# Patient Record
Sex: Female | Born: 1970
Health system: Southern US, Community
[De-identification: ages and names within clinical notes are randomized; demographics above are authoritative.]

## PROBLEM LIST (undated history)

## (undated) DIAGNOSIS — E785 Hyperlipidemia, unspecified: Secondary | ICD-10-CM

## (undated) HISTORY — PX: WISDOM TOOTH EXTRACTION: SHX21

## (undated) HISTORY — DX: Hyperlipidemia, unspecified: E78.5

---

## 2015-08-28 ENCOUNTER — Ambulatory Visit (INDEPENDENT_AMBULATORY_CARE_PROVIDER_SITE_OTHER): Payer: BLUE CROSS/BLUE SHIELD

## 2015-08-28 ENCOUNTER — Ambulatory Visit (INDEPENDENT_AMBULATORY_CARE_PROVIDER_SITE_OTHER): Payer: BLUE CROSS/BLUE SHIELD | Admitting: Physician Assistant

## 2015-08-28 ENCOUNTER — Encounter: Payer: Self-pay | Admitting: Physician Assistant

## 2015-08-28 VITALS — BP 112/41 | HR 73 | Ht 63.0 in | Wt 143.0 lb

## 2015-08-28 DIAGNOSIS — R928 Other abnormal and inconclusive findings on diagnostic imaging of breast: Secondary | ICD-10-CM

## 2015-08-28 DIAGNOSIS — Z1231 Encounter for screening mammogram for malignant neoplasm of breast: Secondary | ICD-10-CM | POA: Diagnosis not present

## 2015-08-28 DIAGNOSIS — K5900 Constipation, unspecified: Secondary | ICD-10-CM | POA: Insufficient documentation

## 2015-08-28 DIAGNOSIS — Z1239 Encounter for other screening for malignant neoplasm of breast: Secondary | ICD-10-CM

## 2015-08-28 NOTE — Patient Instructions (Signed)
Florastor probiotic.   Amitiza twice daily. Linzess once daily.

## 2015-08-28 NOTE — Progress Notes (Signed)
   Subjective:    Patient ID: Carrie [name], female    DOB: 10-12-1970, 45 y.o.   MRN: [id number]  HPI Pt is a 45 yo female who presents to the clinic to establish care.   .. Active Ambulatory Problems    Diagnosis Date Noted  . Constipation 08/28/2015   Resolved Ambulatory Problems    Diagnosis Date Noted  . No Resolved Ambulatory Problems   No Additional Past Medical History   .Marland Kitchen Family History  Problem Relation Age of Onset  . Diabetes Mother    .Marland Kitchen Social History   Social History  . Marital Status: Married    Spouse Name: N/A  . Number of Children: N/A  . Years of Education: N/A   Occupational History  . Not on file.   Social History Main Topics  . Smoking status: Never Smoker   . Smokeless tobacco: Not on file  . Alcohol Use: 0.0 oz/week    0 Standard drinks or equivalent per week  . Drug Use: No  . Sexual Activity: Yes   Other Topics Concern  . Not on file   Social History Narrative  . No narrative on file   Pt has ongoing contstipation. She takes a natural digestive supplement for bowels. Her stools are hard and has one a week. She eats overall healthy and full of fiber. She drinks lots of water.    Review of Systems  All other systems reviewed and are negative.      Objective:   Physical Exam  Constitutional: She is oriented to person, place, and time. She appears well-developed and well-nourished.  HENT:  Head: Normocephalic and atraumatic.  Cardiovascular: Normal rate, regular rhythm and normal heart sounds.   Pulmonary/Chest: Effort normal and breath sounds normal.  Neurological: She is alert and oriented to person, place, and time.  Psychiatric: She has a normal mood and affect. Her behavior is normal.          Assessment & Plan:  Constipation, chronic- dicussed probiotic and starting amitiza/linzess. Pt does not want to start daily medication at this time.   Mammogram ordered.

## 2015-08-29 ENCOUNTER — Encounter: Payer: Self-pay | Admitting: Physician Assistant

## 2015-08-29 DIAGNOSIS — N6310 Unspecified lump in the right breast, unspecified quadrant: Secondary | ICD-10-CM | POA: Insufficient documentation

## 2015-08-30 ENCOUNTER — Other Ambulatory Visit: Payer: Self-pay | Admitting: Physician Assistant

## 2015-08-30 DIAGNOSIS — R928 Other abnormal and inconclusive findings on diagnostic imaging of breast: Secondary | ICD-10-CM

## 2015-09-10 ENCOUNTER — Ambulatory Visit
Admission: RE | Admit: 2015-09-10 | Discharge: 2015-09-10 | Disposition: A | Discharge status: Home / Self Care | Payer: BLUE CROSS/BLUE SHIELD | Source: Ambulatory Visit | Attending: Physician Assistant | Admitting: Physician Assistant

## 2015-09-10 DIAGNOSIS — R928 Other abnormal and inconclusive findings on diagnostic imaging of breast: Secondary | ICD-10-CM

## 2015-09-17 ENCOUNTER — Encounter: Payer: Self-pay | Admitting: Physician Assistant

## 2015-09-17 ENCOUNTER — Telehealth: Payer: Self-pay | Admitting: Physician Assistant

## 2015-09-17 ENCOUNTER — Ambulatory Visit (INDEPENDENT_AMBULATORY_CARE_PROVIDER_SITE_OTHER): Payer: BLUE CROSS/BLUE SHIELD | Admitting: Physician Assistant

## 2015-09-17 ENCOUNTER — Other Ambulatory Visit (HOSPITAL_COMMUNITY)
Admission: RE | Admit: 2015-09-17 | Discharge: 2015-09-17 | Disposition: A | Discharge status: Home / Self Care | Payer: BLUE CROSS/BLUE SHIELD | Source: Ambulatory Visit | Attending: Physician Assistant | Admitting: Physician Assistant

## 2015-09-17 VITALS — BP 112/65 | HR 79 | Ht 63.0 in | Wt 146.0 lb

## 2015-09-17 DIAGNOSIS — Z01419 Encounter for gynecological examination (general) (routine) without abnormal findings: Secondary | ICD-10-CM | POA: Insufficient documentation

## 2015-09-17 DIAGNOSIS — D241 Benign neoplasm of right breast: Secondary | ICD-10-CM

## 2015-09-17 DIAGNOSIS — K5900 Constipation, unspecified: Secondary | ICD-10-CM | POA: Diagnosis not present

## 2015-09-17 DIAGNOSIS — Z0000 Encounter for general adult medical examination without abnormal findings: Secondary | ICD-10-CM

## 2015-09-17 DIAGNOSIS — Z1151 Encounter for screening for human papillomavirus (HPV): Secondary | ICD-10-CM | POA: Insufficient documentation

## 2015-09-17 NOTE — Progress Notes (Signed)
  Subjective:     Carrie [name] is a 45 y.o. female and is here for a comprehensive physical exam. The patient reports problems - hx of constipation. she has not started probiotic at this time that was discussed at last visit. .has BM every 4-6 days. Hx of constipation since teenage years.   LMP 3 weeks ago. Fairly regular cycles. No hx of abnormal pap. Last pap 3-4 years ago.  Social History   Social History  . Marital Status: Married    Spouse Name: N/A  . Number of Children: N/A  . Years of Education: N/A   Occupational History  . Not on file.   Social History Main Topics  . Smoking status: Never Smoker   . Smokeless tobacco: Not on file  . Alcohol Use: 0.0 oz/week    0 Standard drinks or equivalent per week  . Drug Use: No  . Sexual Activity: Yes   Other Topics Concern  . Not on file   Social History Narrative   Health Maintenance  Topic Date Due  . HIV Screening  04/07/1985  . TETANUS/TDAP  04/07/1989  . PAP SMEAR  04/08/1991  . INFLUENZA VACCINE  08/27/2016 (Originally 02/04/2015)    The following portions of the patient's history were reviewed and updated as appropriate: allergies, current medications, past family history, past medical history, past social history, past surgical history and problem list.  Review of Systems Pertinent items noted in HPI and remainder of comprehensive ROS otherwise negative.   Objective:    BP 112/65 mmHg  Pulse 79  Ht 5\' 3"  (1.6 m)  Wt 146 lb (66.225 kg)  BMI 25.87 kg/m2  LMP 09/02/2015 (Approximate) General appearance: alert, cooperative and appears stated age Head: Normocephalic, without obvious abnormality, atraumatic Eyes: conjunctivae/corneas clear. PERRL, EOM's intact. Fundi benign. Ears: normal TM's and external ear canals both ears Nose: Nares normal. Septum midline. Mucosa normal. No drainage or sinus tenderness. Throat: lips, mucosa, and tongue normal; teeth and gums normal Neck: no adenopathy, no carotid bruit, no  JVD, supple, symmetrical, trachea midline and thyroid not enlarged, symmetric, no tenderness/mass/nodules Back: symmetric, no curvature. ROM normal. No CVA tenderness. Lungs: clear to auscultation bilaterally Heart: regular rate and rhythm, S1, S2 normal, no murmur, click, rub or gallop Abdomen: soft, non-tender; bowel sounds normal; no masses,  no organomegaly Pelvic: cervix normal in appearance, external genitalia normal, no adnexal masses or tenderness, no cervical motion tenderness, uterus normal size, shape, and consistency and vagina normal without discharge Extremities: extremities normal, atraumatic, no cyanosis or edema Pulses: 2+ and symmetric Skin: Skin color, texture, turgor normal. No rashes or lesions Lymph nodes: Cervical, supraclavicular, and axillary nodes normal. Neurologic: Grossly normal    Assessment:    Healthy female exam.      Plan:    CPE- declines tdap. pap done today.declines STD testing.  Mammogram up to date. 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 Nurse will call to get them and scan in. Discussed vitamin D 800 units and calcium. Continue healthy diet and exercise.   Fibroadenoma of right breast- will follow in 6 months.   Constipation- discussed probiotics and/or miralax. If not improving linzess or amitiza discussed.  See After Visit Summary for Counseling Recommendations

## 2015-09-17 NOTE — Patient Instructions (Signed)

## 2015-09-17 NOTE — Telephone Encounter (Signed)
Please call down to occupational health and get copy of patients fasting labs.

## 2015-09-19 LAB — CYTOLOGY - PAP

## 2015-09-19 NOTE — Telephone Encounter (Signed)
I spoke with pt to confirm where she had labs drawn & she said in our lab downstairs.  I called the main [facility] office and they don't have her in their system anywhere.  Spoke with pt again and she is going to try to locate the results because the healthy living part of her insurance has already gotten them and deposited the money into her acct.  She will call me back with information.

## 2016-04-02 ENCOUNTER — Other Ambulatory Visit: Payer: Self-pay | Admitting: Physician Assistant

## 2016-04-02 DIAGNOSIS — N6310 Unspecified lump in the right breast, unspecified quadrant: Secondary | ICD-10-CM

## 2016-04-07 ENCOUNTER — Other Ambulatory Visit: Payer: Self-pay

## 2016-04-07 ENCOUNTER — Other Ambulatory Visit: Payer: Self-pay | Admitting: Physician Assistant

## 2016-04-07 DIAGNOSIS — N6310 Unspecified lump in the right breast, unspecified quadrant: Secondary | ICD-10-CM

## 2016-04-08 ENCOUNTER — Ambulatory Visit
Admission: RE | Admit: 2016-04-08 | Discharge: 2016-04-08 | Disposition: A | Discharge status: Home / Self Care | Payer: BLUE CROSS/BLUE SHIELD | Source: Ambulatory Visit | Attending: Physician Assistant | Admitting: Physician Assistant

## 2016-04-08 DIAGNOSIS — N6310 Unspecified lump in the right breast, unspecified quadrant: Secondary | ICD-10-CM | POA: Diagnosis not present

## 2017-03-12 ENCOUNTER — Other Ambulatory Visit: Payer: Self-pay | Admitting: Physician Assistant

## 2017-03-12 DIAGNOSIS — N6310 Unspecified lump in the right breast, unspecified quadrant: Secondary | ICD-10-CM

## 2017-03-19 ENCOUNTER — Inpatient Hospital Stay: Admission: RE | Admit: 2017-03-19 | Payer: BLUE CROSS/BLUE SHIELD | Source: Ambulatory Visit

## 2017-03-22 ENCOUNTER — Other Ambulatory Visit: Payer: BLUE CROSS/BLUE SHIELD

## 2017-03-26 ENCOUNTER — Ambulatory Visit
Admission: RE | Admit: 2017-03-26 | Discharge: 2017-03-26 | Disposition: A | Discharge status: Home / Self Care | Payer: BLUE CROSS/BLUE SHIELD | Source: Ambulatory Visit | Attending: Physician Assistant | Admitting: Physician Assistant

## 2017-03-26 DIAGNOSIS — N6310 Unspecified lump in the right breast, unspecified quadrant: Secondary | ICD-10-CM

## 2017-03-26 DIAGNOSIS — R922 Inconclusive mammogram: Secondary | ICD-10-CM | POA: Diagnosis not present

## 2017-08-17 ENCOUNTER — Ambulatory Visit (INDEPENDENT_AMBULATORY_CARE_PROVIDER_SITE_OTHER): Payer: BLUE CROSS/BLUE SHIELD | Admitting: Physician Assistant

## 2017-08-17 ENCOUNTER — Encounter: Payer: Self-pay | Admitting: Physician Assistant

## 2017-08-17 VITALS — BP 123/67 | HR 77 | Ht 63.0 in | Wt 141.0 lb

## 2017-08-17 DIAGNOSIS — Z0000 Encounter for general adult medical examination without abnormal findings: Secondary | ICD-10-CM | POA: Diagnosis not present

## 2017-08-17 DIAGNOSIS — Z780 Asymptomatic menopausal state: Secondary | ICD-10-CM

## 2017-08-17 DIAGNOSIS — Z131 Encounter for screening for diabetes mellitus: Secondary | ICD-10-CM | POA: Diagnosis not present

## 2017-08-17 DIAGNOSIS — N938 Other specified abnormal uterine and vaginal bleeding: Secondary | ICD-10-CM

## 2017-08-17 NOTE — Patient Instructions (Addendum)
Abnormal Uterine Bleeding Abnormal uterine bleeding means bleeding more than usual from your uterus. It can include:  Bleeding between periods.  Bleeding after sex.  Bleeding that is heavier than normal.  Periods that last longer than usual.  Bleeding after you have stopped having your period (menopause).  There are many problems that may cause this. You should see a doctor for any kind of bleeding that is not normal. Treatment depends on the cause of the bleeding. Follow these instructions at home:  Watch your condition for any changes.  Do not use tampons, douche, or have sex, if your doctor tells you not to.  Change your pads often.  Get regular well-woman exams. Make sure they include a pelvic exam and cervical cancer screening.  Keep all follow-up visits as told by your doctor. This is important. Contact a doctor if:  The bleeding lasts more than one week.  You feel dizzy at times.  You feel like you are going to throw up (nauseous).  You throw up. Get help right away if:  You pass out.  You have to change pads every hour.  You have belly (abdominal) pain.  You have a fever.  You get sweaty.  You get weak.  You passing large blood clots from your vagina. Summary  Abnormal uterine bleeding means bleeding more than usual from your uterus.  There are many problems that may cause this. You should see a doctor for any kind of bleeding that is not normal.  Treatment depends on the cause of the bleeding. This information is not intended to replace advice given to you by your health care provider. Make sure you discuss any questions you have with your health care provider. Document Released: 04/19/2009 Document Revised: 06/16/2016 Document Reviewed: 06/16/2016 Elsevier Interactive Patient Education  2017 [place] [employer]  Get These Tests 1. Blood Pressure- Have your blood pressure checked once a year by your health care provider.  Normal  blood pressure is 120/80. 2. Weight- Have your body mass index (BMI) calculated to screen for obesity.  BMI is measure of body fat based on height and weight.  You can also calculate your own BMI at GravelBags.it. 3. Cholesterol- Have your cholesterol checked every 5 years starting at age 26 then yearly starting at age 48. 56. Chlamydia, HIV, and other sexually transmitted diseases- Get screened every year until age 37, then within three months of each new sexual provider. 5. Pap Test - Every 1-5 years; discuss with your health care provider. 6. Mammogram- Every 1-2 years starting at age 50--50  Take these medicines  Calcium with Vitamin D-Your body needs 1200 mg of Calcium each day and [phone] IU of Vitamin D daily.  Your body can only absorb 500 mg of Calcium at a time so Calcium must be taken in 2 or 3 divided doses throughout the day.  Multivitamin with folic acid- Once daily if it is possible for you to become pregnant.  Get these Immunizations  Gardasil-Series of three doses; prevents HPV related illness such as genital warts and cervical cancer.  Menactra-Single dose; prevents meningitis.  Tetanus shot- Every 10 years.  Flu shot-Every year.  Take these steps 1. Do not smoke-Your healthcare provider can help you quit.  For tips on how to quit go to www.smokefree.gov or call 1-800 QUITNOW. 2. Be physically active- Exercise 5 days a week for at least 30 minutes.  If you are not already physically active, start slow and gradually work up to  30 minutes of moderate physical activity.  Examples of moderate activity include walking briskly, dancing, swimming, bicycling, etc. 3. Breast Cancer- A self breast exam every month is important for early detection of breast cancer.  For more information and instruction on self breast exams, ask your healthcare provider or https://www.patel.info/. 4. Eat a healthy diet- Eat a variety of healthy foods such as fruits,  vegetables, whole grains, low fat milk, low fat cheeses, yogurt, lean meats, poultry and fish, beans, nuts, tofu, etc.  For more information go to www. Thenutritionsource.org 5. Drink alcohol in moderation- Limit alcohol intake to one drink or less per day. Never drink and drive. 6. Depression- Your emotional health is as important as your physical health.  If you're feeling down or losing interest in things you normally enjoy please talk to your healthcare provider about being screened for depression. 7. Dental visit- Brush and floss your teeth twice daily; visit your dentist twice a year. 8. Eye doctor- Get an eye exam at least every 2 years. 9. Helmet use- Always wear a helmet when riding a bicycle, motorcycle, rollerblading or skateboarding. 26. Safe sex- If you may be exposed to sexually transmitted infections, use a condom. 11. Seat belts- Seat belts can save your live; always wear one. 12. Smoke/Carbon Monoxide detectors- These detectors need to be installed on the appropriate level of your home. Replace batteries at least once a year. 13. Skin cancer- When out in the sun please cover up and use sunscreen 15 SPF or higher. 14. Violence- If anyone is threatening or hurting you, please tell your healthcare provider.

## 2017-08-17 NOTE — Progress Notes (Signed)
Subjective:     Carrie [name] is a 47 y.o. female and is here for a comprehensive physical exam. The patient reports problems - see below.    Pt has been having some ongoing persisent bleeding. She was not having a period very often and then in November she had a period that lasted almost 6 weeks. Bleeding was light some days and heavier others. She had a few week break and then bleeding started again. She denies any fever, abdominal pain, pain with intercourse. She wonders if she is headed towards menopause.   Social History   Socioeconomic History  . Marital status: Married    Spouse name: Not on file  . Number of children: Not on file  . Years of education: Not on file  . Highest education level: Not on file  Social Needs  . Financial resource strain: Not on file  . Food insecurity - worry: Not on file  . Food insecurity - inability: Not on file  . Transportation needs - medical: Not on file  . Transportation needs - non-medical: Not on file  Occupational History  . Not on file  Tobacco Use  . Smoking status: Never Smoker  Substance and Sexual Activity  . Alcohol use: Yes    Alcohol/week: 0.0 oz  . Drug use: No  . Sexual activity: Yes  Other Topics Concern  . Not on file  Social History Narrative  . Not on file   Health Maintenance  Topic Date Due  . HIV Screening  04/07/1985  . TETANUS/TDAP  04/07/1989  . INFLUENZA VACCINE  02/03/2017  . PAP SMEAR  09/17/2018    The following portions of the patient's history were reviewed and updated as appropriate: allergies, current medications, past family history, past medical history, past social history, past surgical history and problem list.  Review of Systems Pertinent items noted in HPI and remainder of comprehensive ROS otherwise negative.   Objective:    BP 123/67   Pulse 77   Ht 5\' 3"  (1.6 m)   Wt 141 lb (64 kg)   BMI 24.98 kg/m  General appearance: alert, cooperative and appears stated age Head: Normocephalic,  without obvious abnormality, atraumatic Eyes: conjunctivae/corneas clear. PERRL, EOM's intact. Fundi benign. Ears: normal TM's and external ear canals both ears Nose: Nares normal. Septum midline. Mucosa normal. No drainage or sinus tenderness. Throat: lips, mucosa, and tongue normal; teeth and gums normal Neck: no adenopathy, no carotid bruit, no JVD, supple, symmetrical, trachea midline and thyroid not enlarged, symmetric, no tenderness/mass/nodules Back: symmetric, no curvature. ROM normal. No CVA tenderness. Lungs: clear to auscultation bilaterally Breasts: normal appearance, no masses or tenderness Heart: regular rate and rhythm, S1, S2 normal, no murmur, click, rub or gallop Abdomen: soft, non-tender; bowel sounds normal; no masses,  no organomegaly Extremities: extremities normal, atraumatic, no cyanosis or edema Pulses: 2+ and symmetric Skin: Skin color, texture, turgor normal. No rashes or lesions Lymph nodes: Cervical, supraclavicular, and axillary nodes normal. Neurologic: Alert and oriented X 3, normal strength and tone. Normal symmetric reflexes. Normal coordination and gait   pt declined pelvic exam today due to bleeding.  Assessment:    Healthy female exam.      Plan:    Carrie KitchenMarland [name] was seen today for annual exam.  Diagnoses and all orders for this visit:  Routine physical examination -     FSH/LH -     COMPLETE METABOLIC PANEL WITH GFR -     TSH -  CBC with Differential/Platelet  DUB (dysfunctional uterine bleeding) -     FSH/LH -     COMPLETE METABOLIC PANEL WITH GFR -     TSH -     CBC with Differential/Platelet -     US PELVIS (TRANSABDOMINAL ONLY) -     US PELVIS TRANSVANGINAL NON-OB (TV ONLY)  Screening for diabetes mellitus -     COMPLETE METABOLIC PANEL WITH GFR  .Carrie Kitchen Depression screen [facility] 2/9 08/17/2017  Decreased Interest 0  Down, Depressed, Hopeless 0  PHQ - 2 Score 0  Altered sleeping 1  Tired, decreased energy 1  Change in appetite 0   Feeling bad or failure about yourself  0  Trouble concentrating 0  Moving slowly or fidgety/restless 0  Suicidal thoughts 0  PHQ-9 Score 2  Difficult doing work/chores Not difficult at all    Labs ordered today. Will check CBC to look for any low hgb from blood loss.  Pt has close follow up of right breast mass which is suspected to be fibroadenoma.  Declined Pap and pelvic today.  Declined flu shot.  Fasting cholesterol to be done at work.   .. Discussed 150 minutes of exercise a week.  Encouraged vitamin D 1000 units and Calcium 1300mg  or 4 servings of dairy a day.   Discussed DUB. Labs ordered along with ultrasound. Discussed progesterone to stop bleeding. She would like to hold off for now. Discussed ablation if bleeding did not stop. Follow up as needed. Will consider referral if bleeding does not stop or would like to consider albation. Will confirm menopausal status with testing.    See After Visit Summary for Counseling Recommendations

## 2017-08-18 ENCOUNTER — Encounter: Payer: Self-pay | Admitting: Physician Assistant

## 2017-08-18 DIAGNOSIS — N938 Other specified abnormal uterine and vaginal bleeding: Secondary | ICD-10-CM | POA: Insufficient documentation

## 2017-08-18 DIAGNOSIS — Z780 Asymptomatic menopausal state: Secondary | ICD-10-CM | POA: Insufficient documentation

## 2017-08-18 LAB — CBC WITH DIFFERENTIAL/PLATELET
BASOS PCT: 0.9 %
Basophils Absolute: 60 cells/uL (ref 0–200)
Eosinophils Absolute: 281 cells/uL (ref 15–500)
Eosinophils Relative: 4.2 %
HCT: 38.5 % (ref 35.0–45.0)
HEMOGLOBIN: 12.6 g/dL (ref 11.7–15.5)
Lymphs Abs: 2305 cells/uL (ref 850–3900)
MCH: 27.5 pg (ref 27.0–33.0)
MCHC: 32.7 g/dL (ref 32.0–36.0)
MCV: 83.9 fL (ref 80.0–100.0)
MONOS PCT: 10.1 %
MPV: 10.6 fL (ref 7.5–12.5)
NEUTROS ABS: 3377 {cells}/uL (ref 1500–7800)
Neutrophils Relative %: 50.4 %
PLATELETS: 348 10*3/uL (ref 140–400)
RBC: 4.59 10*6/uL (ref 3.80–5.10)
RDW: 12.3 % (ref 11.0–15.0)
TOTAL LYMPHOCYTE: 34.4 %
WBC mixed population: 677 cells/uL (ref 200–950)
WBC: 6.7 10*3/uL (ref 3.8–10.8)

## 2017-08-18 LAB — COMPLETE METABOLIC PANEL WITH GFR
AG Ratio: 1.6 (calc) (ref 1.0–2.5)
ALKALINE PHOSPHATASE (APISO): 74 U/L (ref 33–115)
ALT: 16 U/L (ref 6–29)
AST: 26 U/L (ref 10–35)
Albumin: 4.7 g/dL (ref 3.6–5.1)
BILIRUBIN TOTAL: 0.4 mg/dL (ref 0.2–1.2)
BUN: 12 mg/dL (ref 7–25)
CHLORIDE: 102 mmol/L (ref 98–110)
CO2: 27 mmol/L (ref 20–32)
Calcium: 9.8 mg/dL (ref 8.6–10.2)
Creat: 0.7 mg/dL (ref 0.50–1.10)
GFR, EST AFRICAN AMERICAN: 120 mL/min/{1.73_m2} (ref >=60)
GFR, Est Non African American: 103 mL/min/{1.73_m2} (ref >=60)
GLUCOSE: 84 mg/dL (ref 65–99)
Globulin: 3 g/dL (calc) (ref 1.9–3.7)
POTASSIUM: 4 mmol/L (ref 3.5–5.3)
Sodium: 137 mmol/L (ref 135–146)
TOTAL PROTEIN: 7.7 g/dL (ref 6.1–8.1)

## 2017-08-18 LAB — FSH/LH
FSH: 25.2 m[IU]/mL
LH: 12.4 m[IU]/mL

## 2017-08-18 LAB — TSH: TSH: 3.19 m[IU]/L

## 2017-08-25 ENCOUNTER — Ambulatory Visit (INDEPENDENT_AMBULATORY_CARE_PROVIDER_SITE_OTHER): Payer: BLUE CROSS/BLUE SHIELD

## 2017-08-25 DIAGNOSIS — N938 Other specified abnormal uterine and vaginal bleeding: Secondary | ICD-10-CM

## 2017-08-25 NOTE — Progress Notes (Signed)
Call pt: u/s showed no fibroids or masses. Appears to be benign cause of endometrial atrophy. Are you interested in trying progesterone to see if we can get bleeding back on track?

## 2018-12-27 DIAGNOSIS — W07XXXA Fall from chair, initial encounter: Secondary | ICD-10-CM | POA: Diagnosis not present

## 2018-12-27 DIAGNOSIS — R220 Localized swelling, mass and lump, head: Secondary | ICD-10-CM | POA: Diagnosis not present

## 2018-12-27 DIAGNOSIS — S0990XA Unspecified injury of head, initial encounter: Secondary | ICD-10-CM | POA: Diagnosis not present

## 2018-12-27 DIAGNOSIS — W2209XA Striking against other stationary object, initial encounter: Secondary | ICD-10-CM | POA: Diagnosis not present

## 2018-12-27 DIAGNOSIS — R030 Elevated blood-pressure reading, without diagnosis of hypertension: Secondary | ICD-10-CM | POA: Diagnosis not present

## 2018-12-27 DIAGNOSIS — S0101XA Laceration without foreign body of scalp, initial encounter: Secondary | ICD-10-CM | POA: Diagnosis not present

## 2018-12-28 ENCOUNTER — Encounter: Payer: Self-pay | Admitting: Physician Assistant

## 2018-12-28 ENCOUNTER — Other Ambulatory Visit: Payer: Self-pay

## 2018-12-28 ENCOUNTER — Ambulatory Visit (INDEPENDENT_AMBULATORY_CARE_PROVIDER_SITE_OTHER): Payer: BC Managed Care – PPO | Admitting: Physician Assistant

## 2018-12-28 VITALS — BP 123/75 | HR 90 | Temp 97.9°F | Ht 63.0 in | Wt 136.0 lb

## 2018-12-28 DIAGNOSIS — S060X0A Concussion without loss of consciousness, initial encounter: Secondary | ICD-10-CM | POA: Insufficient documentation

## 2018-12-28 DIAGNOSIS — S060X0D Concussion without loss of consciousness, subsequent encounter: Secondary | ICD-10-CM

## 2018-12-28 DIAGNOSIS — F0781 Postconcussional syndrome: Secondary | ICD-10-CM | POA: Insufficient documentation

## 2018-12-28 NOTE — Patient Instructions (Signed)
Concussion, Adult A concussion is a brain injury from a direct hit (blow) to the head or body. This injury causes the brain to shake quickly back and forth inside the skull. It is caused by:  A hit to the head.  A quick and sudden movement (jolt) of the head or neck. How fast you will get better from a concussion depends on many things. Recovery can take time. It is important to wait to return to activity until a doctor says it is safe and your symptoms are all gone. Follow these instructions at home: Activity  Limit activities that need a lot of thought or concentration. You may need to talk with your work Freight forwarder or teachers about this. Limit activities such as: ? Homework or work for your job. ? Watching TV. ? Computer work. ? Playing memory games and puzzles.  Rest. Rest helps the brain to heal. Make sure you: ? Get plenty of sleep at night. Do not stay up late. ? Rest during the day. Take naps or rest breaks when you feel tired.  Do not do activities that could cause a second concussion, such as riding a bike or playing sports. It can be dangerous if you get another concussion before the first one has healed.  Ask your doctor when you can return to your normal activities, like driving, riding a bike, or using machinery. Your ability to react may be slower. Do not do these activities if you are dizzy. Your doctor will likely give you a plan for slowly going back to activities. General instructions  Take over-the-counter and prescription medicines only as told by your doctor.  Do not drink alcohol until your doctor says you can.  Watch your symptoms and tell other people to do the same. Other problems (complications) can happen after a concussion. Older adults with a brain injury may have a higher risk of serious problems, such as a blood clot in the brain.  Tell your work Freight forwarder, teachers, Government social research officer, school counselor, coach, or Product/process development scientist about your injury and  symptoms. Tell them about what you can or cannot do. They should watch you for: ? More problems with attention or concentration. ? More trouble remembering or learning new information. ? More time needed to do tasks or assignments. ? Being more annoyed (irritable) or having a harder time dealing with stress. ? Any other symptoms that get worse.  Keep all follow-up visits as told by your doctor. This is important. Prevention  It is very important that you donot get another brain injury, especially before you have healed. In rare cases, another injury can cause permanent brain damage, brain swelling, or death. You have the most risk if you get another head injury in the first 7-10 days after you were hurt before. To avoid injuries: ? Avoid activities that could make you get a second concussion, like contact sports. ? When you have returned to sports or activities:  Avoid plays or moves that can cause you to crash into another person. This is how most concussions happen.  Follow the rules and be respectful of other players. ? Get regular exercise that includes strength and balance training. ? Wear a helmet when you do activities like:  Biking.  Skiing.  Skateboarding.  Skating. ? Helmets can help protect you from serious skull and brain injuries, but they do not protect your from a concussion. Even when wearing a helmet, you should avoid being hit in the head. Contact a doctor if:  Your symptoms get worse or they do not get better.  You have new symptoms.  You have another injury. Get help right away if:  You have bad headaches or your headaches get worse.  You have weakness in any part of your body.  You are confused.  Your coordination gets worse.  You keep throwing up (vomiting).  You feel more sleepy than normal.  You twitch or shake violently (convulse) or have a seizure.  Your speech is not clear (is slurred).  You have strange behavior changes.  You have  changes in how you see (vision).  You pass out (lose consciousness). Summary  A concussion is a brain injury from a direct hit (blow) to the head or body.  This condition is treated with rest and careful watching of symptoms.  If you keep having symptoms, call your doctor. This information is not intended to replace advice given to you by your health care provider. Make sure you discuss any questions you have with your health care provider. Document Released: 06/10/2009 Document Revised: 08/03/2017 Document Reviewed: 08/03/2017 Elsevier Interactive Patient Education  2019 [employer]   Post-Concussion Syndrome  Post-concussion syndrome is when symptoms last longer than normal after a head injury. What are the signs or symptoms? After a head injury, you may:  Have headaches.  Feel tired.  Feel dizzy.  Feel weak.  Have trouble seeing.  Have trouble in bright lights.  Have trouble hearing.  Not be able to remember things.  Not be able to focus.  Have trouble sleeping.  Have mood swings.  Have trouble learning new things. These can last from weeks to months. Follow these instructions at home: Medicines  Take all medicines only as told by your doctor.  Do not take prescription pain medicines. Activity  Limit activities as told by your doctor. This includes: ? Homework. ? Job-related work. ? Thinking. ? Watching TV. ? Using a computer or phone. ? Puzzles. ? Exercise. ? Sports.  Slowly return to your normal activity as told by your doctor.  Stop an activity if you have symptoms.  Do not do anything that may cause you to get injured again. General instructions  Rest. Try to: ? Sleep 7-9 hours each night. ? Take naps or breaks when you feel tired during the day.  Do not drink alcohol until your doctor says that you can.  Keep track of your symptoms.  Keep all follow-up visits as told by your doctor. This is important. Contact a doctor if:   You do not improve.  You get worse.  You have another injury. Get help right away if:  You have a very bad headache.  You feel confused.  You feel very sleepy.  You pass out (faint).  You throw up (vomit).  You feel weak in any part of your body.  You feel numb in any part of your body.  You start shaking (have a seizure).  You have trouble talking. Summary  Post-concussion syndrome is when symptoms last longer than normal after a head injury.  Limit all activity after your injury. Gradually return to normal activity as told by your doctor.  Rest, do not drink alcohol, and avoid prescription pain medicines after a concussion.  Call your doctor if your symptoms get worse. This information is not intended to replace advice given to you by your health care provider. Make sure you discuss any questions you have with your health care provider. Document Released: 07/30/2004 Document Revised: 07/27/2017 Document  Reviewed: 07/27/2017 Elsevier Interactive Patient Education  [employer].

## 2018-12-28 NOTE — Progress Notes (Signed)
   Subjective:    Patient ID: Carrie [name], female    DOB: [birth year], 48 y.o.   MRN: [id number]  HPI Pt is a 47 yo female who presents to the clinic to follow up after "fall while standing in a folding chair in my basement yesterday and the back of my head hit the concrete floor". No LOC. She went to ED last night. No CT was done. 3 staples were placed in the back of her head. She got up this morning to go for a walk and felt nauseated, weak and dizzy. She laid down and now she feels better. She wanted to get checked out.   .. Active Ambulatory Problems    Diagnosis Date Noted  . Constipation 08/28/2015  . Breast mass, right 08/29/2015  . Fibroadenoma of right breast 09/17/2015  . DUB (dysfunctional uterine bleeding) 08/18/2017  . Post-menopausal 08/18/2017   Resolved Ambulatory Problems    Diagnosis Date Noted  . No Resolved Ambulatory Problems   No Additional Past Medical History        Review of Systems    see HPI.  Objective:   Physical Exam Vitals signs reviewed.  Constitutional:      Appearance: Normal appearance.  HENT:     Head:     Comments: 2 staples back of right side of head.     Right Ear: Tympanic membrane normal.     Left Ear: Tympanic membrane normal.     Nose: Nose normal.  Eyes:     Extraocular Movements: Extraocular movements intact.     Pupils: Pupils are equal, round, and reactive to light.  Cardiovascular:     Rate and Rhythm: Normal rate.     Pulses: Normal pulses.  Pulmonary:     Effort: Pulmonary effort is normal.  Neurological:     General: No focal deficit present.     Mental Status: She is alert and oriented to person, place, and time.     Cranial Nerves: No cranial nerve deficit.     Sensory: No sensory deficit.     Motor: No weakness.     Coordination: Coordination normal.     Gait: Gait normal.     Deep Tendon Reflexes: Reflexes normal.  Psychiatric:        Mood and Affect: Mood normal.           Assessment & Plan:   Marland KitchenMarland KitchenFabienne was seen today for fall.  Diagnoses and all orders for this visit:  Concussion without loss of consciousness, subsequent encounter  Post concussion syndrome   Neuro exam intact.discussed this is very reassuring.no need for CT.  Patient is feeling better. Discussed limit physical exertion and try to get brain rest. Stay hydrated. Ok to take ibuprofen and tylenol for pain/headache at this point. HO given.   Return on Monday for staple removal.

## 2019-01-02 ENCOUNTER — Ambulatory Visit (INDEPENDENT_AMBULATORY_CARE_PROVIDER_SITE_OTHER): Payer: BC Managed Care – PPO | Admitting: Physician Assistant

## 2019-01-02 ENCOUNTER — Encounter: Payer: Self-pay | Admitting: Physician Assistant

## 2019-01-02 VITALS — BP 121/83 | HR 76 | Temp 98.1°F | Ht 63.0 in | Wt 137.0 lb

## 2019-01-02 DIAGNOSIS — F0781 Postconcussional syndrome: Secondary | ICD-10-CM | POA: Diagnosis not present

## 2019-01-02 DIAGNOSIS — S0101XD Laceration without foreign body of scalp, subsequent encounter: Secondary | ICD-10-CM | POA: Diagnosis not present

## 2019-01-02 NOTE — Progress Notes (Signed)
   Subjective:    Patient ID: Carrie [name], female    DOB: 03/09/1971, 48 y.o.   MRN: [id number]  HPI Pt is a 48 yo female who presents to the clinic to have staples removed after fall with head laceration and post concussion symptoms. Her fall was 12/27/18. She came into office on 12/28/18 for concussion symptoms. She is doing great today. Head is a little sore. No other problems or concerns.   .. Active Ambulatory Problems    Diagnosis Date Noted  . Constipation 08/28/2015  . Breast mass, right 08/29/2015  . Fibroadenoma of right breast 09/17/2015  . DUB (dysfunctional uterine bleeding) 08/18/2017  . Post-menopausal 08/18/2017  . Post concussion syndrome 12/28/2018  . Concussion with no loss of consciousness 12/28/2018   Resolved Ambulatory Problems    Diagnosis Date Noted  . No Resolved Ambulatory Problems   No Additional Past Medical History     Review of Systems See HPI.     Objective:   Physical Exam HENT:     Head:            Assessment & Plan:  Marland KitchenMarland KitchenBilli was seen today for suture / staple removal.  Diagnoses and all orders for this visit:  Scalp laceration, subsequent encounter  Post concussion syndrome   Pt has had no more symptoms of concussion.  Removed 3 staples. No issues.  Cleaned with alcohol and placed Bactroban on site.  Follow up as needed.

## 2019-01-05 ENCOUNTER — Telehealth: Payer: Self-pay | Admitting: Neurology

## 2019-01-05 NOTE — Telephone Encounter (Signed)
Avoid getting up or standing up suddenly and avoid doing any strenuous activity or exercise.  Just make sure hydrating well.  Even okay to eat a little extra salt today.  If it suddenly gets worse or she develops any vomiting then please seek emergency care as we will be closed tomorrow.

## 2019-01-05 NOTE — Telephone Encounter (Signed)
Patient made aware of advise/recommendations.

## 2019-01-05 NOTE — Telephone Encounter (Signed)
Patient left vm stating she is having some new lightheadedness going on today. Its intermittent. She has been resting. She is status post concussion after fall and hitting the back of her head on 12/27/2018. She had staples removed Monday. Patient of Luvenia Starch, please advise.

## 2019-06-19 DIAGNOSIS — Z20828 Contact with and (suspected) exposure to other viral communicable diseases: Secondary | ICD-10-CM | POA: Diagnosis not present

## 2019-08-08 LAB — BASIC METABOLIC PANEL: Glucose: 86

## 2019-08-08 LAB — LIPID PANEL
Cholesterol: 282 — AB (ref 0–200)
HDL: 103 — AB (ref 35–70)
LDL Cholesterol: 165
Triglycerides: 51 (ref 40–160)

## 2019-08-10 ENCOUNTER — Telehealth: Payer: Self-pay | Admitting: Physician Assistant

## 2019-08-10 NOTE — Telephone Encounter (Signed)
Patient states that she got blood work downstairs and wanted to know if she could use that for her physical coming up. If she can how would that be transferred. Please advise.

## 2019-08-11 NOTE — Telephone Encounter (Signed)
Can we look into this. I do not see any recent blood work. Who was it ordered by? Or can we look in portal. If it is the right labs we can certainly use for [facility].

## 2019-08-14 NOTE — Telephone Encounter (Signed)
Patient made aware.

## 2019-08-14 NOTE — Telephone Encounter (Signed)
Those are the basics. We usually do liver and kidney as well. Bring them in if we need to order can order at [facility].

## 2019-08-14 NOTE — Telephone Encounter (Signed)
Patient had lipid panel and glucose. Will need more labs but printed results for Carrie [name] to review.

## 2019-08-15 ENCOUNTER — Ambulatory Visit (INDEPENDENT_AMBULATORY_CARE_PROVIDER_SITE_OTHER): Payer: BC Managed Care – PPO | Admitting: Physician Assistant

## 2019-08-15 ENCOUNTER — Other Ambulatory Visit: Payer: Self-pay

## 2019-08-15 ENCOUNTER — Other Ambulatory Visit (HOSPITAL_COMMUNITY)
Admission: RE | Admit: 2019-08-15 | Discharge: 2019-08-15 | Disposition: A | Discharge status: Home / Self Care | Payer: BC Managed Care – PPO | Source: Ambulatory Visit | Attending: Physician Assistant | Admitting: Physician Assistant

## 2019-08-15 VITALS — BP 129/69 | HR 75 | Ht 63.0 in | Wt 143.0 lb

## 2019-08-15 DIAGNOSIS — Z124 Encounter for screening for malignant neoplasm of cervix: Secondary | ICD-10-CM | POA: Insufficient documentation

## 2019-08-15 DIAGNOSIS — Z0000 Encounter for general adult medical examination without abnormal findings: Secondary | ICD-10-CM

## 2019-08-15 DIAGNOSIS — M25561 Pain in right knee: Secondary | ICD-10-CM

## 2019-08-15 DIAGNOSIS — N951 Menopausal and female climacteric states: Secondary | ICD-10-CM

## 2019-08-15 NOTE — Patient Instructions (Addendum)
Health Maintenance, Female Adopting a healthy lifestyle and getting preventive care are important in promoting health and wellness. Ask your health care provider about:  The right schedule for you to have regular tests and exams.  Things you can do on your own to prevent diseases and keep yourself healthy. What should I know about diet, weight, and exercise? Eat a healthy diet   Eat a diet that includes plenty of vegetables, fruits, low-fat dairy products, and lean protein.  Do not eat a lot of foods that are high in solid fats, added sugars, or sodium. Maintain a healthy weight Body mass index (BMI) is used to identify weight problems. It estimates body fat based on height and weight. Your health care provider can help determine your BMI and help you achieve or maintain a healthy weight. Get regular exercise Get regular exercise. This is one of the most important things you can do for your health. Most adults should:  Exercise for at least 150 minutes each week. The exercise should increase your heart rate and make you sweat (moderate-intensity exercise).  Do strengthening exercises at least twice a week. This is in addition to the moderate-intensity exercise.  Spend less time sitting. Even light physical activity can be beneficial. Watch cholesterol and blood lipids Have your blood tested for lipids and cholesterol at 50 years of age, then have this test every 5 years. Have your cholesterol levels checked more often if:  Your lipid or cholesterol levels are high.  You are older than 50 years of age.  You are at high risk for heart disease. What should I know about cancer screening? Depending on your health history and family history, you may need to have cancer screening at various ages. This may include screening for:  Breast cancer.  Cervical cancer.  Colorectal cancer.  Skin cancer.  Lung cancer. What should I know about heart disease, diabetes, and high blood  pressure? Blood pressure and heart disease  High blood pressure causes heart disease and increases the risk of stroke. This is more likely to develop in people who have high blood pressure readings, are of African descent, or are overweight.  Have your blood pressure checked: ? Every 3-5 years if you are 18-39 years of age. ? Every year if you are 40 years old or older. Diabetes Have regular diabetes screenings. This checks your fasting blood sugar level. Have the screening done:  Once every three years after age 40 if you are at a normal weight and have a low risk for diabetes.  More often and at a younger age if you are overweight or have a high risk for diabetes. What should I know about preventing infection? Hepatitis B If you have a higher risk for hepatitis B, you should be screened for this virus. Talk with your health care provider to find out if you are at risk for hepatitis B infection. Hepatitis C Testing is recommended for:  Everyone born from 1945 through 1965.  Anyone with known risk factors for hepatitis C. Sexually transmitted infections (STIs)  Get screened for STIs, including gonorrhea and chlamydia, if: ? You are sexually active and are younger than 50 years of age. ? You are older than 50 years of age and your health care provider tells you that you are at risk for this type of infection. ? Your sexual activity has changed since you were last screened, and you are at increased risk for chlamydia or gonorrhea. Ask your health care provider if   you are at risk.  Ask your health care provider about whether you are at high risk for HIV. Your health care provider may recommend a prescription medicine to help prevent HIV infection. If you choose to take medicine to prevent HIV, you should first get tested for HIV. You should then be tested every 3 months for as long as you are taking the medicine. Pregnancy  If you are about to stop having your period (premenopausal) and  you may become pregnant, seek counseling before you get pregnant.  Take 400 to 800 micrograms (mcg) of folic acid every day if you become pregnant.  Ask for birth control (contraception) if you want to prevent pregnancy. Osteoporosis and menopause Osteoporosis is a disease in which the bones lose minerals and strength with aging. This can result in bone fractures. If you are 50 years old or older, or if you are at risk for osteoporosis and fractures, ask your health care provider if you should:  Be screened for bone loss.  Take a calcium or vitamin D supplement to lower your risk of fractures.  Be given hormone replacement therapy (HRT) to treat symptoms of menopause. Follow these instructions at home: Lifestyle  Do not use any products that contain nicotine or tobacco, such as cigarettes, e-cigarettes, and chewing tobacco. If you need help quitting, ask your health care provider.  Do not use street drugs.  Do not share needles.  Ask your health care provider for help if you need support or information about quitting drugs. Alcohol use  Do not drink alcohol if: ? Your health care provider tells you not to drink. ? You are pregnant, may be pregnant, or are planning to become pregnant.  If you drink alcohol: ? Limit how much you use to 0-1 drink a day. ? Limit intake if you are breastfeeding.  Be aware of how much alcohol is in your drink. In the [place] one drink equals one 12 oz bottle of beer (355 mL), one 5 oz glass of wine (148 mL), or one 1 oz glass of hard liquor (44 mL). General instructions  Schedule regular health, dental, and eye exams.  Stay current with your vaccines.  Tell your health care provider if: ? You often feel depressed. ? You have ever been abused or do not feel safe at home. Summary  Adopting a healthy lifestyle and getting preventive care are important in promoting health and wellness.  Follow your health care provider's instructions about healthy  diet, exercising, and getting tested or screened for diseases.  Follow your health care provider's instructions on monitoring your cholesterol and blood pressure. This information is not intended to replace advice given to you by your health care provider. Make sure you discuss any questions you have with your health care provider. Document Revised: 06/15/2018 Document Reviewed: 06/15/2018 Elsevier Patient Education  2020 [employer].  Patellofemoral Pain Syndrome  Patellofemoral pain syndrome is a condition in which the tissue (cartilage) on the underside of the kneecap (patella) softens or breaks down. This causes pain in the front of the knee. The condition is also called runner's knee or chondromalacia patella. Patellofemoral pain syndrome is most common in young adults who are active in sports. The knee is the largest joint in the body. The patella covers the front of the knee and is attached to muscles above and below the knee. The underside of the patella is covered with a smooth type of cartilage (synovium). The smooth surface helps the patella to glide  easily when you move your knee. Patellofemoral pain syndrome causes swelling in the joint linings and bone surfaces in the knee. What are the causes? This condition may be caused by:  Overuse of the knee.  Poor alignment of your knee joints.  Weak leg muscles.  A direct blow to your kneecap. What increases the risk? You are more likely to develop this condition if:  You do a lot of activities that can wear down your kneecap. These include: ? Running. ? Squatting. ? Climbing stairs.  You start a new physical activity or exercise program.  You wear shoes that do not fit well.  You do not have good leg strength.  You are overweight. What are the signs or symptoms? The main symptom of this condition is knee pain. This may feel like a dull, aching pain underneath your patella, in the front of your knee. There may be a popping  or cracking sound when you move your knee. Pain may get worse with:  Exercise.  Climbing stairs.  Running.  Jumping.  Squatting.  Kneeling.  Sitting for a long time.  Moving or pushing on your patella. How is this diagnosed? This condition may be diagnosed based on:  Your symptoms and medical history. You may be asked about your recent physical activities and which ones cause knee pain.  A physical exam. This may include: ? Moving your patella back and forth. ? Checking your range of knee motion. ? Having you squat or jump to see if you have pain. ? Checking the strength of your leg muscles.  Imaging tests to confirm the diagnosis. These may include an MRI of your knee. How is this treated? This condition may be treated at home with rest, ice, compression, and elevation (RICE).  Other treatments may include:  Nonsteroidal anti-inflammatory drugs (NSAIDs).  Physical therapy to stretch and strengthen your leg muscles.  Shoe inserts (orthotics) to take stress off your knee.  A knee brace or knee support.  Adhesive tapes to the skin.  Surgery to remove damaged cartilage or move the patella to a better position. This is rare. Follow these instructions at home: If you have a shoe or brace:  Wear the shoe or brace as told by your health care provider. Remove it only as told by your health care provider.  Loosen the shoe or brace if your toes tingle, become numb, or turn cold and blue.  Keep the shoe or brace clean.  If the shoe or brace is not waterproof: ? Do not let it get wet. ? Cover it with a watertight covering when you take a bath or a shower. Managing pain, stiffness, and swelling  If directed, put ice on the painful area. ? If you have a removable shoe or brace, remove it as told by your health care provider. ? Put ice in a plastic bag. ? Place a towel between your skin and the bag. ? Leave the ice on for 20 minutes, 2-3 times a day.  Move your toes  often to avoid stiffness and to lessen swelling.  Rest your knee: ? Avoid activities that cause knee pain. ? When sitting or lying down, raise (elevate) the injured area above the level of your heart, whenever possible. General instructions  Take over-the-counter and prescription medicines only as told by your health care provider.  Use splints, braces, knee supports, or walking aids as directed by your health care provider.  Perform stretching and strengthening exercises as told by your health  care provider or physical therapist.  Do not use any products that contain nicotine or tobacco, such as cigarettes and e-cigarettes. These can delay healing. If you need help quitting, ask your health care provider.  Return to your normal activities as told by your health care provider. Ask your health care provider what activities are safe for you.  Keep all follow-up visits as told by your health care provider. This is important. Contact a health care provider if:  Your symptoms get worse.  You are not improving with home care. Summary  Patellofemoral pain syndrome is a condition in which the tissue (cartilage) on the underside of the kneecap (patella) softens or breaks down.  This condition causes swelling in the joint linings and bone surfaces in the knee. This leads to pain in the front of the knee.  This condition may be treated at home with rest, ice, compression, and elevation (RICE).  Use splints, braces, knee supports, or walking aids as directed by your health care provider. This information is not intended to replace advice given to you by your health care provider. Make sure you discuss any questions you have with your health care provider. Document Revised: 08/02/2017 Document Reviewed: 08/02/2017 Elsevier Patient Education  2020 [employer].

## 2019-08-15 NOTE — Progress Notes (Signed)
Subjective:     Carrie [name] is a 49 y.o. female and is here for a comprehensive physical exam. The patient reports problems - right knee pain. worse with exercise, running, up and downstairs. seems to pop alot. not tried anything to make better. no injury. hx of runner. .  Social History   Socioeconomic History  . Marital status: Married    Spouse name: Not on file  . Number of children: Not on file  . Years of education: Not on file  . Highest education level: Not on file  Occupational History  . Not on file  Tobacco Use  . Smoking status: Never Smoker  . Smokeless tobacco: Never Used  Substance and Sexual Activity  . Alcohol use: Yes    Alcohol/week: 0.0 standard drinks  . Drug use: No  . Sexual activity: Yes  Other Topics Concern  . Not on file  Social History Narrative  . Not on file   Social Determinants of Health   Financial Resource Strain:   . Difficulty of Paying Living Expenses: Not on file  Food Insecurity:   . Worried About Charity fundraiser in the Last Year: Not on file  . Ran Out of Food in the Last Year: Not on file  Transportation Needs:   . Lack of Transportation (Medical): Not on file  . Lack of Transportation (Non-Medical): Not on file  Physical Activity:   . Days of Exercise per Week: Not on file  . Minutes of Exercise per Session: Not on file  Stress:   . Feeling of Stress : Not on file  Social Connections:   . Frequency of Communication with Friends and Family: Not on file  . Frequency of Social Gatherings with Friends and Family: Not on file  . Attends Religious Services: Not on file  . Active Member of Clubs or Organizations: Not on file  . Attends Archivist Meetings: Not on file  . Marital Status: Not on file  Intimate Partner Violence:   . Fear of Current or Ex-Partner: Not on file  . Emotionally Abused: Not on file  . Physically Abused: Not on file  . Sexually Abused: Not on file   Health Maintenance  Topic Date Due  .  MAMMOGRAM  03/26/2018  . PAP SMEAR-Modifier  09/17/2018  . INFLUENZA VACCINE  10/04/2019 (Originally 02/04/2019)  . HIV Screening  08/14/2020 (Originally 04/07/1985)  . TETANUS/TDAP  07/06/2020    The following portions of the patient's history were reviewed and updated as appropriate: allergies, current medications, past family history, past medical history, past social history, past surgical history and problem list.  Review of Systems A comprehensive review of systems was negative.   Objective:    BP 129/69   Pulse 75   Ht 5\' 3"  (1.6 m)   Wt 143 lb (64.9 kg)   SpO2 98%   BMI 25.33 kg/m  General appearance: alert, cooperative and appears stated age Head: Normocephalic, without obvious abnormality, atraumatic Eyes: conjunctivae/corneas clear. PERRL, EOM's intact. Fundi benign. Ears: normal TM's and external ear canals both ears Nose: Nares normal. Septum midline. Mucosa normal. No drainage or sinus tenderness. Throat: lips, mucosa, and tongue normal; teeth and gums normal Neck: no adenopathy, no carotid bruit, no JVD, supple, symmetrical, trachea midline and thyroid not enlarged, symmetric, no tenderness/mass/nodules Back: symmetric, no curvature. ROM normal. No CVA tenderness. Lungs: clear to auscultation bilaterally Breasts: normal appearance, no masses or tenderness Heart: regular rate and rhythm, S1, S2 normal, no  murmur, click, rub or gallop Abdomen: soft, non-tender; bowel sounds normal; no masses,  no organomegaly Pelvic: cervix normal in appearance, external genitalia normal, no adnexal masses or tenderness, no cervical motion tenderness, uterus normal size, shape, and consistency and vagina normal without discharge Extremities: extremities normal, atraumatic, no cyanosis or edema right knee popping behind patella with some tenderness to deep palpation. ROM and strength 5/5. No joint tenderness. No effusion or redness.  Pulses: 2+ and symmetric Skin: Skin color, texture,  turgor normal. No rashes or lesions Lymph nodes: Cervical, supraclavicular, and axillary nodes normal. Neurologic: Alert and oriented X 3, normal strength and tone. Normal symmetric reflexes. Normal coordination and gait    Assessment:    Healthy female exam.      Plan:     Marland KitchenMarland KitchenIyonia was seen today for annual exam.  Diagnoses and all orders for this visit:  Routine physical examination  Routine Papanicolaou smear -     Cytology - PAP  Acute pain of right knee  Perimenopausal   .. Depression screen [facility] 2/9 08/15/2019 08/17/2017  Decreased Interest 0 0  Down, Depressed, Hopeless 0 0  PHQ - 2 Score 0 0  Altered sleeping 1 1  Tired, decreased energy 0 1  Change in appetite 0 0  Feeling bad or failure about yourself  0 0  Trouble concentrating 0 0  Moving slowly or fidgety/restless 0 0  Suicidal thoughts 0 0  PHQ-9 Score 1 2  Difficult doing work/chores Not difficult at all Not difficult at all   .Marland Kitchen Discussed 150 minutes of exercise a week.  Encouraged vitamin D 1000 units and Calcium 1300mg  or 4 servings of dairy a day.  Reviewed labs: LDL 165, TG 51, HDL 103. Glucose 86.  .8 10 year CV risk. Reassurance given.  Mammogram needed. Pt will schedule with breast clinic.  Pap done today. Declined STD testing.  No period in 7 months. Perimenopausal. Discussed menopause.  Flu shot declined.   Right knee popping/pain- discussed patellar femoral syndrome. Discussed knee strap with exercise. Encouraged home exercises will give HO. If not improving or worsening go to Dr.T sports medicine here in office.   See After Visit Summary for Counseling Recommendations

## 2019-08-16 ENCOUNTER — Encounter: Payer: Self-pay | Admitting: Physician Assistant

## 2019-08-16 DIAGNOSIS — M25561 Pain in right knee: Secondary | ICD-10-CM | POA: Insufficient documentation

## 2019-08-16 DIAGNOSIS — N951 Menopausal and female climacteric states: Secondary | ICD-10-CM | POA: Insufficient documentation

## 2019-08-17 LAB — CYTOLOGY - PAP
Comment: NEGATIVE
Diagnosis: NEGATIVE
High risk HPV: NEGATIVE

## 2019-08-18 NOTE — Progress Notes (Signed)
Call pt:   Normal pap. Normal cells. Negative HPV. Due to age and risk and negative HPV. You could go 5 years until next pap.

## 2020-03-20 ENCOUNTER — Ambulatory Visit: Payer: BC Managed Care – PPO | Admitting: Physician Assistant

## 2020-04-08 ENCOUNTER — Other Ambulatory Visit: Payer: Self-pay | Admitting: Physician Assistant

## 2020-04-08 DIAGNOSIS — Z1231 Encounter for screening mammogram for malignant neoplasm of breast: Secondary | ICD-10-CM

## 2020-04-26 ENCOUNTER — Telehealth: Payer: Self-pay | Admitting: Neurology

## 2020-04-26 ENCOUNTER — Other Ambulatory Visit: Payer: Self-pay

## 2020-04-26 ENCOUNTER — Ambulatory Visit
Admission: RE | Admit: 2020-04-26 | Discharge: 2020-04-26 | Disposition: A | Discharge status: Home / Self Care | Payer: BC Managed Care – PPO | Source: Ambulatory Visit | Attending: Physician Assistant | Admitting: Physician Assistant

## 2020-04-26 DIAGNOSIS — N6310 Unspecified lump in the right breast, unspecified quadrant: Secondary | ICD-10-CM

## 2020-04-26 DIAGNOSIS — Z1231 Encounter for screening mammogram for malignant neoplasm of breast: Secondary | ICD-10-CM

## 2020-04-26 DIAGNOSIS — D241 Benign neoplasm of right breast: Secondary | ICD-10-CM

## 2020-04-26 NOTE — Telephone Encounter (Signed)
Patient left vm stating she showed up at the breast center today for screening mgm and they told her she needs a diagnostic, they need an order from [facility]. It looks like last mgm/ultrasound in the system is from 2018 where they recommended diagnostic mgm/ultrasound in one year. No other mgms in media or care everywhere. Will discuss with patient. [phone].

## 2020-04-29 NOTE — Telephone Encounter (Signed)
Signed.

## 2020-04-29 NOTE — Telephone Encounter (Signed)
Spoke with patient. She confirms last scan was in 2018. They want a diagnostic (stated in last scan that she needed) please sign if appropriate.

## 2020-05-24 ENCOUNTER — Other Ambulatory Visit: Payer: Self-pay

## 2020-05-24 ENCOUNTER — Ambulatory Visit
Admission: RE | Admit: 2020-05-24 | Discharge: 2020-05-24 | Disposition: A | Discharge status: Home / Self Care | Payer: BC Managed Care – PPO | Source: Ambulatory Visit | Attending: Physician Assistant | Admitting: Physician Assistant

## 2020-05-24 DIAGNOSIS — N6310 Unspecified lump in the right breast, unspecified quadrant: Secondary | ICD-10-CM

## 2020-05-24 DIAGNOSIS — D241 Benign neoplasm of right breast: Secondary | ICD-10-CM

## 2020-05-24 DIAGNOSIS — N6323 Unspecified lump in the left breast, lower outer quadrant: Secondary | ICD-10-CM | POA: Diagnosis not present

## 2020-05-24 DIAGNOSIS — R922 Inconclusive mammogram: Secondary | ICD-10-CM | POA: Diagnosis not present

## 2020-05-24 NOTE — Progress Notes (Signed)
GREAT news. Benign right sided breast mass. Keep regularly scheduled screening follow ups.

## 2020-08-16 ENCOUNTER — Encounter: Payer: BC Managed Care – PPO | Admitting: Physician Assistant

## 2020-08-19 ENCOUNTER — Ambulatory Visit (INDEPENDENT_AMBULATORY_CARE_PROVIDER_SITE_OTHER): Payer: BC Managed Care – PPO | Admitting: Physician Assistant

## 2020-08-19 ENCOUNTER — Other Ambulatory Visit: Payer: Self-pay

## 2020-08-19 ENCOUNTER — Encounter: Payer: Self-pay | Admitting: Physician Assistant

## 2020-08-19 VITALS — BP 119/65 | HR 72 | Ht 63.0 in | Wt 135.0 lb

## 2020-08-19 DIAGNOSIS — Z23 Encounter for immunization: Secondary | ICD-10-CM

## 2020-08-19 DIAGNOSIS — Z1159 Encounter for screening for other viral diseases: Secondary | ICD-10-CM

## 2020-08-19 DIAGNOSIS — E010 Iodine-deficiency related diffuse (endemic) goiter: Secondary | ICD-10-CM

## 2020-08-19 DIAGNOSIS — Z0000 Encounter for general adult medical examination without abnormal findings: Secondary | ICD-10-CM | POA: Diagnosis not present

## 2020-08-19 DIAGNOSIS — Z131 Encounter for screening for diabetes mellitus: Secondary | ICD-10-CM

## 2020-08-19 DIAGNOSIS — Z114 Encounter for screening for human immunodeficiency virus [HIV]: Secondary | ICD-10-CM

## 2020-08-19 DIAGNOSIS — Z13220 Encounter for screening for lipoid disorders: Secondary | ICD-10-CM | POA: Diagnosis not present

## 2020-08-19 DIAGNOSIS — Z1211 Encounter for screening for malignant neoplasm of colon: Secondary | ICD-10-CM

## 2020-08-19 DIAGNOSIS — Z1329 Encounter for screening for other suspected endocrine disorder: Secondary | ICD-10-CM

## 2020-08-19 NOTE — Patient Instructions (Addendum)
Health Maintenance, Female Adopting a healthy lifestyle and getting preventive care are important in promoting health and wellness. Ask your health care provider about:  The right schedule for you to have regular tests and exams.  Things you can do on your own to prevent diseases and keep yourself healthy. What should I know about diet, weight, and exercise? Eat a healthy diet  Eat a diet that includes plenty of vegetables, fruits, low-fat dairy products, and lean protein.  Do not eat a lot of foods that are high in solid fats, added sugars, or sodium.   Maintain a healthy weight Body mass index (BMI) is used to identify weight problems. It estimates body fat based on height and weight. Your health care provider can help determine your BMI and help you achieve or maintain a healthy weight. Get regular exercise Get regular exercise. This is one of the most important things you can do for your health. Most adults should:  Exercise for at least 150 minutes each week. The exercise should increase your heart rate and make you sweat (moderate-intensity exercise).  Do strengthening exercises at least twice a week. This is in addition to the moderate-intensity exercise.  Spend less time sitting. Even light physical activity can be beneficial. Watch cholesterol and blood lipids Have your blood tested for lipids and cholesterol at 51 years of age, then have this test every 5 years. Have your cholesterol levels checked more often if:  Your lipid or cholesterol levels are high.  You are older than 51 years of age.  You are at high risk for heart disease. What should I know about cancer screening? Depending on your health history and family history, you may need to have cancer screening at various ages. This may include screening for:  Breast cancer.  Cervical cancer.  Colorectal cancer.  Skin cancer.  Lung cancer. What should I know about heart disease, diabetes, and high blood  pressure? Blood pressure and heart disease  High blood pressure causes heart disease and increases the risk of stroke. This is more likely to develop in people who have high blood pressure readings, are of African descent, or are overweight.  Have your blood pressure checked: ? Every 3-5 years if you are 18-39 years of age. ? Every year if you are 40 years old or older. Diabetes Have regular diabetes screenings. This checks your fasting blood sugar level. Have the screening done:  Once every three years after age 40 if you are at a normal weight and have a low risk for diabetes.  More often and at a younger age if you are overweight or have a high risk for diabetes. What should I know about preventing infection? Hepatitis B If you have a higher risk for hepatitis B, you should be screened for this virus. Talk with your health care provider to find out if you are at risk for hepatitis B infection. Hepatitis C Testing is recommended for:  Everyone born from 1945 through 1965.  Anyone with known risk factors for hepatitis C. Sexually transmitted infections (STIs)  Get screened for STIs, including gonorrhea and chlamydia, if: ? You are sexually active and are younger than 51 years of age. ? You are older than 51 years of age and your health care provider tells you that you are at risk for this type of infection. ? Your sexual activity has changed since you were last screened, and you are at increased risk for chlamydia or gonorrhea. Ask your health care provider   if you are at risk.  Ask your health care provider about whether you are at high risk for HIV. Your health care provider may recommend a prescription medicine to help prevent HIV infection. If you choose to take medicine to prevent HIV, you should first get tested for HIV. You should then be tested every 3 months for as long as you are taking the medicine. Pregnancy  If you are about to stop having your period (premenopausal) and  you may become pregnant, seek counseling before you get pregnant.  Take 400 to 800 micrograms (mcg) of folic acid every day if you become pregnant.  Ask for birth control (contraception) if you want to prevent pregnancy. Osteoporosis and menopause Osteoporosis is a disease in which the bones lose minerals and strength with aging. This can result in bone fractures. If you are 33 years old or older, or if you are at risk for osteoporosis and fractures, ask your health care provider if you should:  Be screened for bone loss.  Take a calcium or vitamin D supplement to lower your risk of fractures.  Be given hormone replacement therapy (HRT) to treat symptoms of menopause. Follow these instructions at home: Lifestyle  Do not use any products that contain nicotine or tobacco, such as cigarettes, e-cigarettes, and chewing tobacco. If you need help quitting, ask your health care provider.  Do not use street drugs.  Do not share needles.  Ask your health care provider for help if you need support or information about quitting drugs. Alcohol use  Do not drink alcohol if: ? Your health care provider tells you not to drink. ? You are pregnant, may be pregnant, or are planning to become pregnant.  If you drink alcohol: ? Limit how much you use to 0-1 drink a day. ? Limit intake if you are breastfeeding.  Be aware of how much alcohol is in your drink. In the [place] one drink equals one 12 oz bottle of beer (355 mL), one 5 oz glass of wine (148 mL), or one 1 oz glass of hard liquor (44 mL). General instructions  Schedule regular health, dental, and eye exams.  Stay current with your vaccines.  Tell your health care provider if: ? You often feel depressed. ? You have ever been abused or do not feel safe at home. Summary  Adopting a healthy lifestyle and getting preventive care are important in promoting health and wellness.  Follow your health care provider's instructions about healthy  diet, exercising, and getting tested or screened for diseases.  Follow your health care provider's instructions on monitoring your cholesterol and blood pressure. This information is not intended to replace advice given to you by your health care provider. Make sure you discuss any questions you have with your health care provider. Document Revised: 06/15/2018 Document Reviewed: 06/15/2018 Elsevier Patient Education  2021 [place].  Kegel Exercises  Kegel exercises can help strengthen your pelvic floor muscles. The pelvic floor is a group of muscles that support your rectum, small intestine, and bladder. In females, pelvic floor muscles also help support the womb (uterus). These muscles help you control the flow of urine and stool. Kegel exercises are painless and simple, and they do not require any equipment. Your provider may suggest Kegel exercises to:  Improve bladder and bowel control.  Improve sexual response.  Improve weak pelvic floor muscles after surgery to remove the uterus (hysterectomy) or pregnancy (females).  Improve weak pelvic floor muscles after prostate gland removal or  surgery (males). Kegel exercises involve squeezing your pelvic floor muscles, which are the same muscles you squeeze when you try to stop the flow of urine or keep from passing gas. The exercises can be done while sitting, standing, or lying down, but it is best to vary your position. Exercises How to do Kegel exercises: 1. Squeeze your pelvic floor muscles tight. You should feel a tight lift in your rectal area. If you are a female, you should also feel a tightness in your vaginal area. Keep your stomach, buttocks, and legs relaxed. 2. Hold the muscles tight for up to 10 seconds. 3. Breathe normally. 4. Relax your muscles. 5. Repeat as told by your health care provider. Repeat this exercise daily as told by your health care provider. Continue to do this exercise for at least 4-6 weeks, or for as  long as told by your health care provider. You may be referred to a physical therapist who can help you learn more about how to do Kegel exercises. Depending on your condition, your health care provider may recommend:  Varying how long you squeeze your muscles.  Doing several sets of exercises every day.  Doing exercises for several weeks.  Making Kegel exercises a part of your regular exercise routine. This information is not intended to replace advice given to you by your health care provider. Make sure you discuss any questions you have with your health care provider. Document Revised: 10/27/2019 Document Reviewed: 02/09/2018 Elsevier Patient Education  2021 [place].  Overactive Bladder, Adult  Overactive bladder is a condition in which a person has a sudden and frequent need to urinate. A person might also leak urine if he or she cannot get to the bathroom fast enough (urinary incontinence). Sometimes, symptoms can interfere with work or social activities. What are the causes? Overactive bladder is associated with poor nerve signals between your bladder and your brain. Your bladder may get the signal to empty before it is full. You may also have very sensitive muscles that make your bladder squeeze too soon. This condition may also be caused by other factors, such as:  Medical conditions: ? Urinary tract infection. ? Infection of nearby tissues. ? Prostate enlargement. ? Bladder stones, inflammation, or tumors. ? Diabetes. ? Muscle or nerve weakness, especially from these conditions:  A spinal cord injury.  Stroke.  Multiple sclerosis.  Parkinson's disease.  Other causes: ? Surgery on the uterus or urethra. ? Drinking too much caffeine or alcohol. ? Certain medicines, especially those that eliminate extra fluid in the body (diuretics). ? Constipation. What increases the risk? You may be at greater risk for overactive bladder if you:  Are an older  adult.  Smoke.  Are going through menopause.  Have prostate problems.  Have a neurological disease, such as stroke, dementia, Parkinson's disease, or multiple sclerosis (MS).  Eat or drink alcohol, spicy food, caffeine, and other things that irritate the bladder.  Are overweight or obese. What are the signs or symptoms? Symptoms of this condition include a sudden, strong urge to urinate. Other symptoms include:  Leaking urine.  Urinating 8 or more times a day.  Waking up to urinate 2 or more times overnight. How is this diagnosed? This condition may be diagnosed based on:  Your symptoms and medical history.  A physical exam.  Blood or urine tests to check for possible causes, such as infection. You may also need to see a health care provider who specializes in urinary tract problems. This  is called a urologist. How is this treated? Treatment for overactive bladder depends on the cause of your condition and whether it is mild or severe. Treatment may include:  Bladder training, such as: ? Learning to control the urge to urinate by following a schedule to urinate at regular intervals. ? Doing Kegel exercises to strengthen the pelvic floor muscles that support your bladder.  Special devices, such as: ? Biofeedback. This uses sensors to help you become aware of your body's signals. ? Electrical stimulation. This uses electrodes placed inside the body (implanted) or outside the body. These electrodes send gentle pulses of electricity to strengthen the nerves or muscles that control the bladder. ? Women may use a plastic device, called a pessary, that fits into the vagina and supports the bladder.  Medicines, such as: ? Antibiotics to treat bladder infection. ? Antispasmodics to stop the bladder from releasing urine at the wrong time. ? Tricyclic antidepressants to relax bladder muscles. ? Injections of botulinum toxin type A directly into the bladder tissue to relax bladder  muscles.  Surgery, such as: ? A device may be implanted to help manage the nerve signals that control urination. ? An electrode may be implanted to stimulate electrical signals in the bladder. ? A procedure may be done to change the shape of the bladder. This is done only in very severe cases. Follow these instructions at home: Eating and drinking  Make diet or lifestyle changes recommended by your health care provider. These may include: ? Drinking fluids throughout the day and not only with meals. ? Cutting down on caffeine or alcohol. ? Eating a healthy and balanced diet to prevent constipation. This may include:  Choosing foods that are high in fiber, such as beans, whole grains, and fresh fruits and vegetables.  Limiting foods that are high in fat and processed sugars, such as fried and sweet foods.   Lifestyle  Lose weight if needed.  Do not use any products that contain nicotine or tobacco. These include cigarettes, chewing tobacco, and vaping devices, such as e-cigarettes. If you need help quitting, ask your health care provider.   General instructions  Take over-the-counter and prescription medicines only as told by your health care provider.  If you were prescribed an antibiotic medicine, take it as told by your health care provider. Do not stop taking the antibiotic even if you start to feel better.  Use any implants or pessary as told by your health care provider.  If needed, wear pads to absorb urine leakage.  Keep a log to track how much and when you drink, and when you need to urinate. This will help your health care provider monitor your condition.  Keep all follow-up visits. This is important. Contact a health care provider if:  You have a fever or chills.  Your symptoms do not get better with treatment.  Your pain and discomfort get worse.  You have more frequent urges to urinate. Get help right away if:  You are not able to control your  bladder. Summary  Overactive bladder refers to a condition in which a person has a sudden and frequent need to urinate.  Several conditions may lead to an overactive bladder.  Treatment for overactive bladder depends on the cause and severity of your condition.  Making lifestyle changes, doing Kegel exercises, keeping a log, and taking medicines can help with this condition. This information is not intended to replace advice given to you by your health care provider. Make  sure you discuss any questions you have with your health care provider. Document Revised: 03/11/2020 Document Reviewed: 03/11/2020 Elsevier Patient Education  2021 [employer].

## 2020-08-19 NOTE — Progress Notes (Signed)
Subjective:     Carrie [name] is a 50 y.o. female and is here for a comprehensive physical exam. The patient reports problems - she does complain of frequent urinary leakage with stress and urge. she wonders what she can do to improve. .  Social History   Socioeconomic History  . Marital status: Married    Spouse name: Not on file  . Number of children: Not on file  . Years of education: Not on file  . Highest education level: Not on file  Occupational History  . Not on file  Tobacco Use  . Smoking status: Never Smoker  . Smokeless tobacco: Never Used  Substance and Sexual Activity  . Alcohol use: Yes    Alcohol/week: 0.0 standard drinks  . Drug use: No  . Sexual activity: Yes  Other Topics Concern  . Not on file  Social History Narrative  . Not on file   Social Determinants of Health   Financial Resource Strain: Not on file  Food Insecurity: Not on file  Transportation Needs: Not on file  Physical Activity: Not on file  Stress: Not on file  Social Connections: Not on file  Intimate Partner Violence: Not on file   Health Maintenance  Topic Date Due  . Hepatitis C Screening  Never done  . COLONOSCOPY (Pts 45-80yrs Insurance coverage will need to be confirmed)  Never done  . INFLUENZA VACCINE  10/03/2020 (Originally 02/04/2020)  . MAMMOGRAM  05/24/2021  . PAP SMEAR-Modifier  08/14/2024  . TETANUS/TDAP  08/19/2030  . HIV Screening  Completed    The following portions of the patient's history were reviewed and updated as appropriate: allergies, current medications, past family history, past medical history, past social history, past surgical history and problem list.  Review of Systems Pertinent items noted in HPI and remainder of comprehensive ROS otherwise negative.   Objective:    BP 119/65   Pulse 72   Ht 5\' 3"  (1.6 m)   Wt 135 lb (61.2 kg)   LMP 09/02/2015 (Approximate)   SpO2 100%   BMI 23.91 kg/m  General appearance: alert, cooperative and appears stated  age Head: Normocephalic, without obvious abnormality, atraumatic Eyes: conjunctivae/corneas clear. PERRL, EOM's intact. Fundi benign. Ears: normal TM's and external ear canals both ears Nose: Nares normal. Septum midline. Mucosa normal. No drainage or sinus tenderness. Throat: lips, mucosa, and tongue normal; teeth and gums normal Neck: no adenopathy, no carotid bruit, no JVD, supple, symmetrical, trachea midline and thyroid: enlarged Back: symmetric, no curvature. ROM normal. No CVA tenderness. Lungs: clear to auscultation bilaterally Heart: regular rate and rhythm, S1, S2 normal, no murmur, click, rub or gallop Abdomen: soft, non-tender; bowel sounds normal; no masses,  no organomegaly Extremities: extremities normal, atraumatic, no cyanosis or edema Pulses: 2+ and symmetric Skin: Skin color, texture, turgor normal. No rashes or lesions Lymph nodes: Cervical, supraclavicular, and axillary nodes normal. Neurologic: Alert and oriented X 3, normal strength and tone. Normal symmetric reflexes. Normal coordination and gait   .Marland Kitchen Depression screen [facility] 2/9 08/19/2020 08/15/2019 08/17/2017  Decreased Interest 0 0 0  Down, Depressed, Hopeless 0 0 0  PHQ - 2 Score 0 0 0  Altered sleeping - 1 1  Tired, decreased energy - 0 1  Change in appetite - 0 0  Feeling bad or failure about yourself  - 0 0  Trouble concentrating - 0 0  Moving slowly or fidgety/restless - 0 0  Suicidal thoughts - 0 0  PHQ-9 Score -  1 2  Difficult doing work/chores - Not difficult at all Not difficult at all   .Marland Kitchen GAD 7 : Generalized Anxiety Score 08/15/2019 08/17/2017  Nervous, Anxious, on Edge 0 0  Control/stop worrying 0 0  Worry too much - different things 0 0  Trouble relaxing 0 0  Restless 0 0  Easily annoyed or irritable 0 1  Afraid - awful might happen 0 0  Total GAD 7 Score 0 1  Anxiety Difficulty Not difficult at all Not difficult at all     Assessment:    Healthy female exam.      Plan:    Marland KitchenMarland KitchenDarrel was  seen today for annual exam.  Diagnoses and all orders for this visit:  Routine physical examination -     CBC with Differential/Platelet -     COMPLETE METABOLIC PANEL WITH GFR -     Lipid Panel w/reflex Direct LDL -     TSH -     Ambulatory referral to Gastroenterology  Screening, lipid -     Lipid Panel w/reflex Direct LDL  Thyroid disorder screen -     TSH  Screening for diabetes mellitus -     COMPLETE METABOLIC PANEL WITH GFR  Colon cancer screening -     Ambulatory referral to Gastroenterology  Screening for HIV without presence of risk factors -     HIV antibody (with reflex)  Encounter for hepatitis C screening test for low risk patient -     Hepatitis C Antibody  Thyromegaly -     US THYROID  Need for diphtheria-tetanus-pertussis (Tdap) vaccine -     Tdap vaccine greater than or equal to 7yo IM   .Marland Kitchen Discussed 150 minutes of exercise a week.  Encouraged vitamin D 1000 units and Calcium 1300mg  or 4 servings of dairy a day.  Fasting labs ordered.  Pap UTD.  Mammogram UTD.  Ordered colonoscopy.  Tdap done today.  Declined flu shot.  Declined covid vaccine.  Per patient never had varicella and does not want shingrix.   Thyroid is large to palpation. No tenderness. Ordered ultrasound. TSH ordered in labs.    See After Visit Summary for Counseling Recommendations

## 2020-08-20 ENCOUNTER — Encounter: Payer: Self-pay | Admitting: Physician Assistant

## 2020-08-21 ENCOUNTER — Other Ambulatory Visit: Payer: Self-pay

## 2020-08-21 ENCOUNTER — Ambulatory Visit (INDEPENDENT_AMBULATORY_CARE_PROVIDER_SITE_OTHER): Payer: BC Managed Care – PPO

## 2020-08-21 DIAGNOSIS — E049 Nontoxic goiter, unspecified: Secondary | ICD-10-CM

## 2020-08-21 NOTE — Progress Notes (Signed)
Call pt: diffusely enlarged thyroid but no nodules or worrisome areas. We will continue to monitor TSH levels to make sure remains functioning.

## 2020-08-26 DIAGNOSIS — Z0000 Encounter for general adult medical examination without abnormal findings: Secondary | ICD-10-CM | POA: Diagnosis not present

## 2020-08-26 DIAGNOSIS — E063 Autoimmune thyroiditis: Secondary | ICD-10-CM | POA: Diagnosis not present

## 2020-08-26 DIAGNOSIS — Z131 Encounter for screening for diabetes mellitus: Secondary | ICD-10-CM | POA: Diagnosis not present

## 2020-08-26 DIAGNOSIS — Z1159 Encounter for screening for other viral diseases: Secondary | ICD-10-CM | POA: Diagnosis not present

## 2020-08-26 DIAGNOSIS — Z1329 Encounter for screening for other suspected endocrine disorder: Secondary | ICD-10-CM | POA: Diagnosis not present

## 2020-08-26 DIAGNOSIS — Z114 Encounter for screening for human immunodeficiency virus [HIV]: Secondary | ICD-10-CM | POA: Diagnosis not present

## 2020-08-26 DIAGNOSIS — Z13220 Encounter for screening for lipoid disorders: Secondary | ICD-10-CM | POA: Diagnosis not present

## 2020-08-27 ENCOUNTER — Encounter: Payer: Self-pay | Admitting: Physician Assistant

## 2020-08-27 DIAGNOSIS — R7989 Other specified abnormal findings of blood chemistry: Secondary | ICD-10-CM | POA: Insufficient documentation

## 2020-08-27 DIAGNOSIS — R748 Abnormal levels of other serum enzymes: Secondary | ICD-10-CM | POA: Insufficient documentation

## 2020-08-27 DIAGNOSIS — R9389 Abnormal findings on diagnostic imaging of other specified body structures: Secondary | ICD-10-CM | POA: Insufficient documentation

## 2020-08-27 DIAGNOSIS — E7800 Pure hypercholesterolemia, unspecified: Secondary | ICD-10-CM | POA: Insufficient documentation

## 2020-08-27 LAB — HEPATITIS C ANTIBODY
Hepatitis C Ab: NONREACTIVE
SIGNAL TO CUT-OFF: 0.01 (ref <1.00)

## 2020-08-27 LAB — HIV ANTIBODY (ROUTINE TESTING W REFLEX): HIV 1&2 Ab, 4th Generation: NONREACTIVE

## 2020-08-27 NOTE — Progress Notes (Signed)
Carrie [name],   Thyroid normal range but upper limits of normal toward HYPO functioning thyroid. A low dose thyroid medication could make you feel a lot better. Thoughts?   JJ please add anti-tpo testing to labs.   LDL is elevated but HDL(good cholesterol) is amazing. No indication with current risk factors to treat with statin.   Your liver enzymes are up and they have not been in the past. Are you taking tylenol? Are you drinking more alcohol? Need to recheck in 2 weeks avoiding both.

## 2020-08-28 ENCOUNTER — Encounter: Payer: Self-pay | Admitting: Physician Assistant

## 2020-08-28 DIAGNOSIS — E063 Autoimmune thyroiditis: Secondary | ICD-10-CM | POA: Insufficient documentation

## 2020-08-28 LAB — THYROID PEROXIDASE ANTIBODY: Thyroperoxidase Ab SerPl-aCnc: >900 IU/mL — ABNORMAL HIGH (ref <9)

## 2020-08-28 LAB — CBC WITH DIFFERENTIAL/PLATELET
Absolute Monocytes: 340 cells/uL (ref 200–950)
Basophils Absolute: 39 cells/uL (ref 0–200)
Basophils Relative: 0.9 %
Eosinophils Absolute: 181 cells/uL (ref 15–500)
Eosinophils Relative: 4.2 %
HCT: 42.7 % (ref 35.0–45.0)
Hemoglobin: 14.1 g/dL (ref 11.7–15.5)
Lymphs Abs: 1673 cells/uL (ref 850–3900)
MCH: 30.3 pg (ref 27.0–33.0)
MCHC: 33 g/dL (ref 32.0–36.0)
MCV: 91.8 fL (ref 80.0–100.0)
MPV: 10.2 fL (ref 7.5–12.5)
Monocytes Relative: 7.9 %
Neutro Abs: 2068 cells/uL (ref 1500–7800)
Neutrophils Relative %: 48.1 %
Platelets: 298 10*3/uL (ref 140–400)
RBC: 4.65 10*6/uL (ref 3.80–5.10)
RDW: 12.4 % (ref 11.0–15.0)
Total Lymphocyte: 38.9 %
WBC: 4.3 10*3/uL (ref 3.8–10.8)

## 2020-08-28 LAB — LIPID PANEL W/REFLEX DIRECT LDL
Cholesterol: 274 mg/dL — ABNORMAL HIGH (ref <200)
HDL: 104 mg/dL (ref >=50)
LDL Cholesterol (Calc): 155 mg/dL (calc) — ABNORMAL HIGH
Non-HDL Cholesterol (Calc): 170 mg/dL (calc) — ABNORMAL HIGH (ref <130)
Total CHOL/HDL Ratio: 2.6 (calc) (ref <5.0)
Triglycerides: 53 mg/dL (ref <150)

## 2020-08-28 LAB — COMPLETE METABOLIC PANEL WITH GFR
AG Ratio: 1.5 (calc) (ref 1.0–2.5)
ALT: 51 U/L — ABNORMAL HIGH (ref 6–29)
AST: 48 U/L — ABNORMAL HIGH (ref 10–35)
Albumin: 4.1 g/dL (ref 3.6–5.1)
Alkaline phosphatase (APISO): 118 U/L (ref 37–153)
BUN: 9 mg/dL (ref 7–25)
CO2: 30 mmol/L (ref 20–32)
Calcium: 9.5 mg/dL (ref 8.6–10.4)
Chloride: 102 mmol/L (ref 98–110)
Creat: 0.73 mg/dL (ref 0.50–1.05)
GFR, Est African American: 111 mL/min/{1.73_m2} (ref >=60)
GFR, Est Non African American: 96 mL/min/{1.73_m2} (ref >=60)
Globulin: 2.7 g/dL (calc) (ref 1.9–3.7)
Glucose, Bld: 82 mg/dL (ref 65–99)
Potassium: 4.8 mmol/L (ref 3.5–5.3)
Sodium: 138 mmol/L (ref 135–146)
Total Bilirubin: 0.5 mg/dL (ref 0.2–1.2)
Total Protein: 6.8 g/dL (ref 6.1–8.1)

## 2020-08-28 LAB — TSH: TSH: 4.01 mIU/L

## 2020-08-28 NOTE — Progress Notes (Signed)
Your thyroid antibodies are positive meaning you do have hashimoto's thyroiditis, autoimmune disease that attacks your thyroid,  but your thyroid levels have been in normal range. Over time that likely will change. We will keep monitoring. At times an anti-inflammatory diet can help with this condition. More than happy to talk with you if you have any questions.

## 2020-08-29 ENCOUNTER — Encounter: Payer: Self-pay | Admitting: Physician Assistant

## 2020-10-30 ENCOUNTER — Other Ambulatory Visit: Payer: Self-pay

## 2020-10-30 ENCOUNTER — Telehealth: Payer: Self-pay

## 2020-10-30 DIAGNOSIS — R748 Abnormal levels of other serum enzymes: Secondary | ICD-10-CM

## 2020-10-30 NOTE — Telephone Encounter (Signed)
Ok sounds good

## 2020-10-30 NOTE — Telephone Encounter (Signed)
Pt called asking for labs to check her liver enzymes. Pt was due for recheck in early March due to elevated AST and ALT. CMP ordered.

## 2020-10-31 ENCOUNTER — Encounter: Payer: Self-pay | Admitting: Gastroenterology

## 2020-11-08 DIAGNOSIS — R748 Abnormal levels of other serum enzymes: Secondary | ICD-10-CM | POA: Diagnosis not present

## 2020-11-08 LAB — COMPLETE METABOLIC PANEL WITH GFR
AG Ratio: 1.7 (calc) (ref 1.0–2.5)
ALT: 37 U/L — ABNORMAL HIGH (ref 6–29)
AST: 35 U/L (ref 10–35)
Albumin: 4.2 g/dL (ref 3.6–5.1)
Alkaline phosphatase (APISO): 113 U/L (ref 37–153)
BUN: 11 mg/dL (ref 7–25)
CO2: 29 mmol/L (ref 20–32)
Calcium: 9.5 mg/dL (ref 8.6–10.4)
Chloride: 102 mmol/L (ref 98–110)
Creat: 0.73 mg/dL (ref 0.50–1.05)
GFR, Est African American: 111 mL/min/{1.73_m2} (ref >=60)
GFR, Est Non African American: 96 mL/min/{1.73_m2} (ref >=60)
Globulin: 2.5 g/dL (calc) (ref 1.9–3.7)
Glucose, Bld: 80 mg/dL (ref 65–99)
Potassium: 3.9 mmol/L (ref 3.5–5.3)
Sodium: 138 mmol/L (ref 135–146)
Total Bilirubin: 0.8 mg/dL (ref 0.2–1.2)
Total Protein: 6.7 g/dL (ref 6.1–8.1)

## 2020-11-13 ENCOUNTER — Other Ambulatory Visit: Payer: Self-pay | Admitting: Physician Assistant

## 2020-11-13 NOTE — Telephone Encounter (Signed)
Ok recheck again in 15months.

## 2020-11-13 NOTE — Telephone Encounter (Signed)
Carrie [name],   Your liver enzymes are improving a lot. One is just barely elevated. What have you been doing?

## 2020-11-14 ENCOUNTER — Telehealth: Payer: Self-pay | Admitting: Neurology

## 2020-11-14 NOTE — Telephone Encounter (Signed)
Patient left vm asking if she could chose her own GI doctor and change her colonoscopy appt. Called and made her aware she definitely could, let us know if she needs a referral to another MD. She will call if needed.

## 2020-11-20 ENCOUNTER — Encounter: Payer: Self-pay | Admitting: Physician Assistant

## 2021-01-09 ENCOUNTER — Ambulatory Visit (AMBULATORY_SURGERY_CENTER): Payer: BC Managed Care – PPO | Admitting: *Deleted

## 2021-01-09 ENCOUNTER — Other Ambulatory Visit: Payer: Self-pay

## 2021-01-09 VITALS — Ht 63.0 in | Wt 141.0 lb

## 2021-01-09 DIAGNOSIS — Z1211 Encounter for screening for malignant neoplasm of colon: Secondary | ICD-10-CM

## 2021-01-09 MED ORDER — PEG-KCL-NACL-NASULF-NA ASC-C 100 G PO SOLR: 1.0000 | Freq: Once | ORAL | 0 refills | Status: AC

## 2021-01-09 NOTE — Progress Notes (Signed)
No egg or soy allergy known to patient  No issues with past sedation with any surgeries or procedures Patient denies ever being told they had issues or difficulty with intubation  No FH of Malignant Hyperthermia No diet pills per patient No home 02 use per patient  No blood thinners per patient  Pt denies issues with constipation  No A fib or A flutter  EMMI video to pt or via [place] 19 guidelines implemented in [place] today with Pt and RN  Pt is fully vaccinated  for [employer] given to pt in PV today , Code to Pharmacy and  NO PA's for preps discussed with pt In PV today  Discussed with pt there will be an out-of-pocket cost for prep and that varies from $0 to 70 dollars   Due to the COVID-19 pandemic we are asking patients to follow certain guidelines.  Pt aware of COVID protocols and [facility] guidelines

## 2021-01-24 ENCOUNTER — Other Ambulatory Visit: Payer: Self-pay

## 2021-01-24 ENCOUNTER — Encounter: Payer: Self-pay | Admitting: Gastroenterology

## 2021-01-24 ENCOUNTER — Ambulatory Visit (AMBULATORY_SURGERY_CENTER): Payer: BC Managed Care – PPO | Admitting: Gastroenterology

## 2021-01-24 VITALS — BP 127/86 | HR 85 | Temp 98.0°F | Resp 16 | Ht 63.0 in | Wt 141.0 lb

## 2021-01-24 DIAGNOSIS — Z1211 Encounter for screening for malignant neoplasm of colon: Secondary | ICD-10-CM | POA: Diagnosis not present

## 2021-01-24 DIAGNOSIS — K6389 Other specified diseases of intestine: Secondary | ICD-10-CM | POA: Diagnosis not present

## 2021-01-24 DIAGNOSIS — K5900 Constipation, unspecified: Secondary | ICD-10-CM | POA: Diagnosis not present

## 2021-01-24 DIAGNOSIS — D129 Benign neoplasm of anus and anal canal: Secondary | ICD-10-CM

## 2021-01-24 DIAGNOSIS — D128 Benign neoplasm of rectum: Secondary | ICD-10-CM

## 2021-01-24 DIAGNOSIS — K621 Rectal polyp: Secondary | ICD-10-CM

## 2021-01-24 MED ORDER — SODIUM CHLORIDE 0.9 % IV SOLN: 500.0000 mL | INTRAVENOUS | Status: DC

## 2021-01-24 NOTE — Progress Notes (Signed)
PT taken to PACU. Monitors in place. VSS. Report given to RN. 

## 2021-01-24 NOTE — Progress Notes (Signed)
Called to room to assist during endoscopic procedure.  Patient ID and intended procedure confirmed with present staff. Received instructions for my participation in the procedure from the performing physician.  

## 2021-01-24 NOTE — Patient Instructions (Signed)
Handout given for polyps.  Await pathology results.  YOU HAD AN ENDOSCOPIC PROCEDURE TODAY AT [facility]:   Refer to the procedure report that was given to you for any specific questions about what was found during the examination.  If the procedure report does not answer your questions, please call your gastroenterologist to clarify.  If you requested that your care partner not be given the details of your procedure findings, then the procedure report has been included in a sealed envelope for you to review at your convenience later.  YOU SHOULD EXPECT: Some feelings of bloating in the abdomen. Passage of more gas than usual.  Walking can help get rid of the air that was put into your GI tract during the procedure and reduce the bloating. If you had a lower endoscopy (such as a colonoscopy or flexible sigmoidoscopy) you may notice spotting of blood in your stool or on the toilet paper. If you underwent a bowel prep for your procedure, you may not have a normal bowel movement for a few days.  Please Note:  You might notice some irritation and congestion in your nose or some drainage.  This is from the oxygen used during your procedure.  There is no need for concern and it should clear up in a day or so.  SYMPTOMS TO REPORT IMMEDIATELY:  Following lower endoscopy (colonoscopy or flexible sigmoidoscopy):  Excessive amounts of blood in the stool  Significant tenderness or worsening of abdominal pains  Swelling of the abdomen that is new, acute  Fever of 100F or higher  For urgent or emergent issues, a gastroenterologist can be reached at any hour by calling [phone]. Do not use MyChart messaging for urgent concerns.    DIET:  We do recommend a small meal at first, but then you may proceed to your regular diet.  Drink plenty of fluids but you should avoid alcoholic beverages for 24 hours.  ACTIVITY:  You should plan to take it easy for the rest of today and you should NOT  DRIVE or use heavy machinery until tomorrow (because of the sedation medicines used during the test).    FOLLOW UP: Our staff will call the number listed on your records 48-72 hours following your procedure to check on you and address any questions or concerns that you may have regarding the information given to you following your procedure. If we do not reach you, we will leave a message.  We will attempt to reach you two times.  During this call, we will ask if you have developed any symptoms of COVID 19. If you develop any symptoms (ie: fever, flu-like symptoms, shortness of breath, cough etc.) before then, please call [phone].  If you test positive for Covid 19 in the 2 weeks post procedure, please call and report this information to [place].    If any biopsies were taken you will be contacted by phone or by letter within the next 1-3 weeks.  Please call us at [phone] if you have not heard about the biopsies in 3 weeks.    SIGNATURES/CONFIDENTIALITY: You and/or your care partner have signed paperwork which will be entered into your electronic medical record.  These signatures attest to the fact that that the information above on your After Visit Summary has been reviewed and is understood.  Full responsibility of the confidentiality of this discharge information lies with you and/or your care-partner.

## 2021-01-24 NOTE — Op Note (Addendum)
[facility] Patient Name: Carrie [name] Procedure Date: 01/24/2021 7:55 AM MRN: [id number] Endoscopist: Thornton Park MD, MD Age: 50 Referring MD:  Date of Birth: [birth year] Gender: Female Account #: [id number] Procedure:                Colonoscopy Indications:              Screening for colorectal malignant neoplasm, This                            is the patient's first colonoscopy, Incidental                            constipation noted                           No known family history of colon cancer or polyps Medicines:                Monitored Anesthesia Care Procedure:                Pre-Anesthesia Assessment:                           - Prior to the procedure, a History and Physical                            was performed, and patient medications and                            allergies were reviewed. The patient's tolerance of                            previous anesthesia was also reviewed. The risks                            and benefits of the procedure and the sedation                            options and risks were discussed with the patient.                            All questions were answered, and informed consent                            was obtained. Prior Anticoagulants: The patient has                            taken no previous anticoagulant or antiplatelet                            agents. ASA Grade Assessment: II - A patient with                            mild systemic disease. After reviewing the risks  and benefits, the patient was deemed in                            satisfactory condition to undergo the procedure.                           After obtaining informed consent, the colonoscope                            was passed under direct vision. Throughout the                            procedure, the patient's blood pressure, pulse, and                            oxygen saturations were monitored  continuously. The                            Olympus CF-[id number] ([id number]) Colonoscope was                            introduced through the anus and advanced to the 3                            cm into the ileum. A second forward view of the                            right colon was performed. The colonoscopy was                            technically difficult and complex due to a                            redundant colon, significant looping and a tortuous                            colon. Successful completion of the procedure was                            aided by changing the patient's position,                            withdrawing and reinserting the scope and applying                            abdominal pressure. The patient tolerated the                            procedure well. The quality of the bowel                            preparation was good. Over 86m of liquid was  removed during the procedure. The terminal ileum,                            ileocecal valve, appendiceal orifice, and rectum                            were photographed. Scope In: 8:08:04 AM Scope Out: 8:28:09 AM Scope Withdrawal Time: 0 hours 10 minutes 50 seconds  Total Procedure Duration: 0 hours 20 minutes 5 seconds  Findings:                 The perianal and digital rectal examinations were                            normal.                           A diffuse area of moderate melanosis was found in                            the entire colon.                           A 2 mm polyp was found in the rectum. The polyp was                            sessile. The polyp was removed with a cold snare.                            Resection and retrieval were complete. Estimated                            blood loss was minimal.                           The colon is tortuous and redundant. The exam was                            otherwise without abnormality on direct and                             retroflexion views. Complications:            Transient bradycardia treated with Robinul.                            Estimated blood loss: Minimal. Estimated Blood Loss:     Estimated blood loss was minimal. Impression:               - Melanosis in the colon.                           - One 2 mm polyp in the rectum, removed with a cold                            snare. Resected and retrieved.                           -  Tortuous and redundant colon. Recommendation:           - Patient has a contact number available for                            emergencies. The signs and symptoms of potential                            delayed complications were discussed with the                            patient. Return to normal activities tomorrow.                            Written discharge instructions were provided to the                            patient.                           - Resume previous diet.                           - Continue present medications.                           - Await pathology results.                           - Repeat colonoscopy date to be determined after                            pending pathology results are reviewed for                            surveillance.                           - Emerging evidence supports eating a diet of                            fruits, vegetables, grains, calcium, and yogurt                            while reducing red meat and alcohol may reduce the                            risk of colon cancer.                           - Thank you for allowing me to be involved in your                            colon cancer prevention. Thornton Park MD, MD 01/24/2021 8:32:25 AM This report has been signed electronically.

## 2021-01-24 NOTE — Progress Notes (Signed)
Vs CW ° °

## 2021-01-28 ENCOUNTER — Telehealth: Payer: Self-pay

## 2021-01-28 NOTE — Telephone Encounter (Signed)
  Follow up Call-  Call back number 01/24/2021  Post procedure Call Back phone  # [phone]  Permission to leave phone message Yes  Some recent data might be hidden     Patient questions:  Do you have a fever, pain , or abdominal swelling? No. Pain Score  0 *  Have you tolerated food without any problems? Yes.    Have you been able to return to your normal activities? Yes.    Do you have any questions about your discharge instructions: Diet   No. Medications  No. Follow up visit  No.  Do you have questions or concerns about your Care? No.  Actions: * If pain score is 4 or above: No action needed, pain <4.

## 2021-01-30 ENCOUNTER — Encounter: Payer: Self-pay | Admitting: Gastroenterology

## 2021-05-27 ENCOUNTER — Telehealth: Payer: BC Managed Care – PPO | Admitting: Physician Assistant

## 2021-05-27 DIAGNOSIS — R6889 Other general symptoms and signs: Secondary | ICD-10-CM | POA: Diagnosis not present

## 2021-05-27 MED ORDER — BENZONATATE 100 MG PO CAPS: 100.0000 mg | ORAL_CAPSULE | Freq: Three times a day (TID) | ORAL | 0 refills | Status: DC | PRN

## 2021-05-27 MED ORDER — LIDOCAINE VISCOUS HCL 2 % MT SOLN: OROMUCOSAL | 0 refills | Status: AC

## 2021-05-27 NOTE — Progress Notes (Signed)
E visit for Flu like symptoms   We are sorry that you are not feeling well.  Here is how we plan to help! Based on what you have shared with me it looks like you may have a respiratory virus that may be influenza.  Influenza or "the flu" is   an infection caused by a respiratory virus. The flu virus is highly contagious and persons who did not receive their yearly flu vaccination may "catch" the flu from close contact.  We have anti-viral medications to treat the viruses that cause this infection. They are not a "cure" and only shorten the course of the infection. These prescriptions are most effective when they are given within the first 2 days of "flu" symptoms. Antiviral medication are indicated if you have a high risk of complications from the flu. You should  also consider an antiviral medication if you are in close contact with someone who is at risk. These medications can help patients avoid complications from the flu  but have side effects that you should know. Possible side effects from Tamiflu or oseltamivir include nausea, vomiting, diarrhea, dizziness, headaches, eye redness, sleep problems or other respiratory symptoms. You should not take Tamiflu if you have an allergy to oseltamivir or any to the ingredients in Tamiflu.  Based upon your symptoms and potential risk factors I recommend that you follow the flu symptoms recommendation that I have listed below.  I have prescribed Tessalon perles for the cough and viscous lidocaine for the sore throat.  ANYONE WHO HAS FLU SYMPTOMS SHOULD: Stay home. The flu is highly contagious and going out or to work exposes others! Be sure to drink plenty of fluids. Water is fine as well as fruit juices, sodas and electrolyte beverages. You may want to stay away from caffeine or alcohol. If you are nauseated, try taking small sips of liquids. How do you know if you are getting enough fluid? Your urine should be a pale yellow or almost colorless. Get  rest. Taking a steamy shower or using a humidifier may help nasal congestion and ease sore throat pain. Using a saline nasal spray works much the same way. Cough drops, hard candies and sore throat lozenges may ease your cough. Line up a caregiver. Have someone check on you regularly.   GET HELP RIGHT AWAY IF: You cannot keep down liquids or your medications. You become short of breath Your fell like you are going to pass out or loose consciousness. Your symptoms persist after you have completed your treatment plan MAKE SURE YOU  Understand these instructions. Will watch your condition. Will get help right away if you are not doing well or get worse.  Your e-visit answers were reviewed by a board certified advanced clinical practitioner to complete your personal care plan.  Depending on the condition, your plan could have included both over the counter or prescription medications.  If there is a problem please reply  once you have received a response from your provider.  Your safety is important to [place].  If you have drug allergies check your prescription carefully.    You can use MyChart to ask questions about today's visit, request a non-urgent call back, or ask for a work or school excuse for 24 hours related to this e-Visit. If it has been greater than 24 hours you will need to follow up with your provider, or enter a new e-Visit to address those concerns.  You will get an e-mail in the next two  days asking about your experience.  I hope that your e-visit has been valuable and will speed your recovery. Thank you for using e-visits.  I provided 5 minutes of non face-to-face time during this encounter for chart review and documentation.

## 2021-08-29 IMAGING — MG DIGITAL DIAGNOSTIC BILAT W/ TOMO W/ CAD
8 series · 9 of 24 positions shown · non-contrast
Comparison: Previous exam(s).

CLINICAL DATA: 49-year-old female presenting for annual bilateral
mammogram and delayed follow-up of a probably benign right breast
mass.

EXAM:
DIGITAL DIAGNOSTIC BILATERAL MAMMOGRAM WITH CAD AND TOMO
ULTRASOUND RIGHT BREAST

[L CC synth-2D]
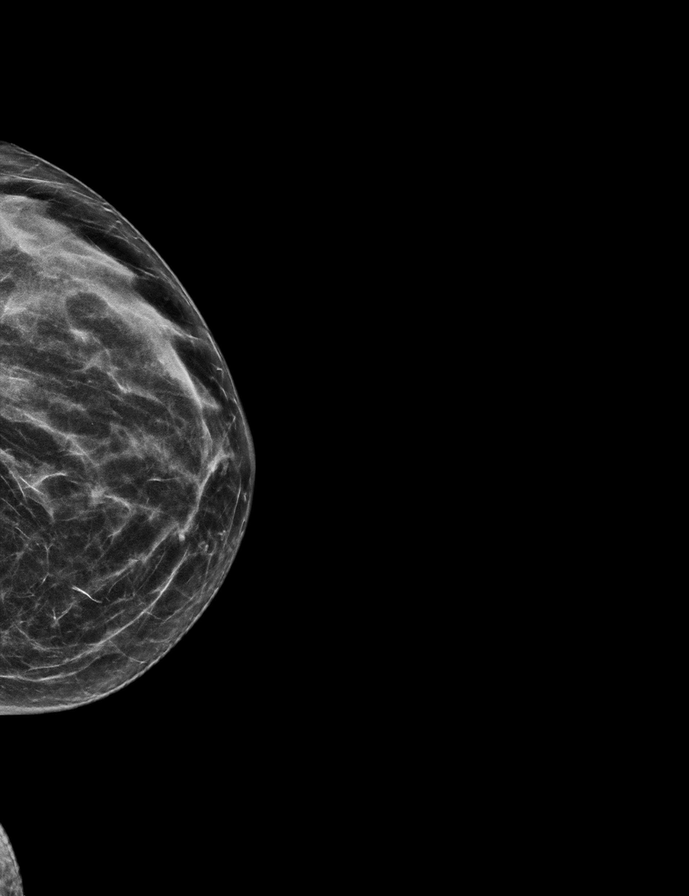

[R MLO synth-2D]
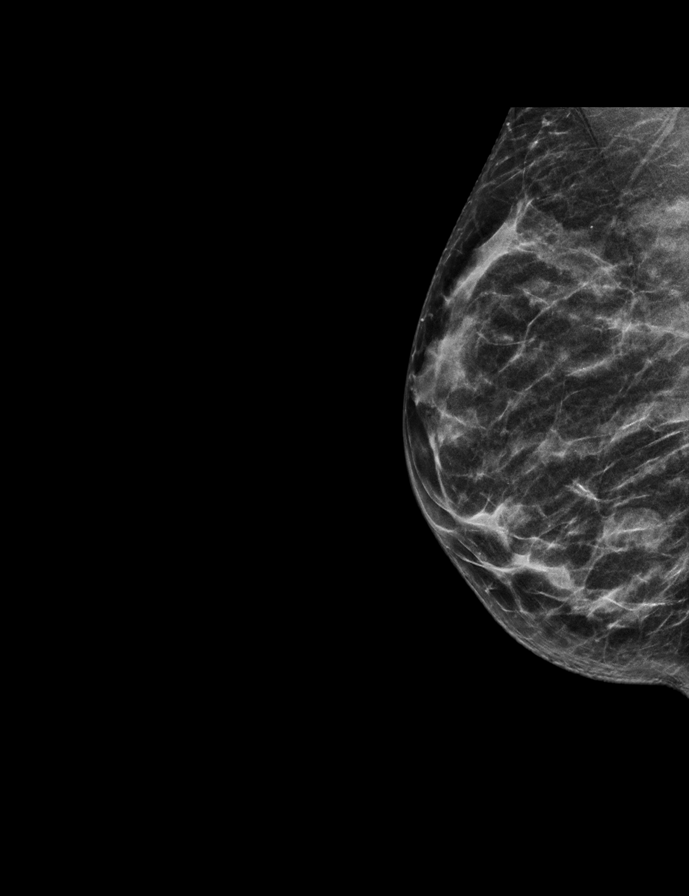

[R CC synth-2D]
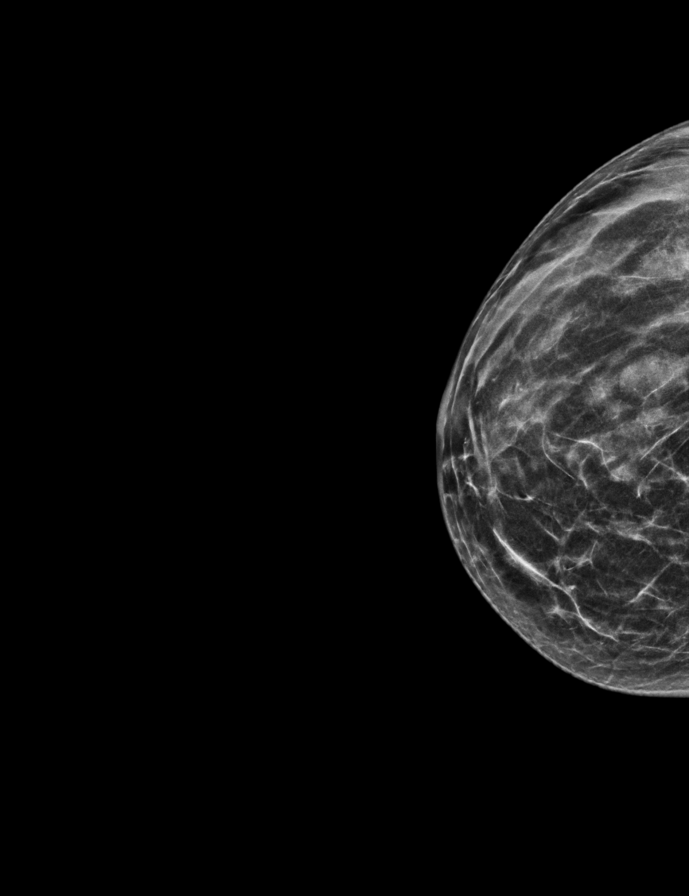

[L MLO synth-2D]
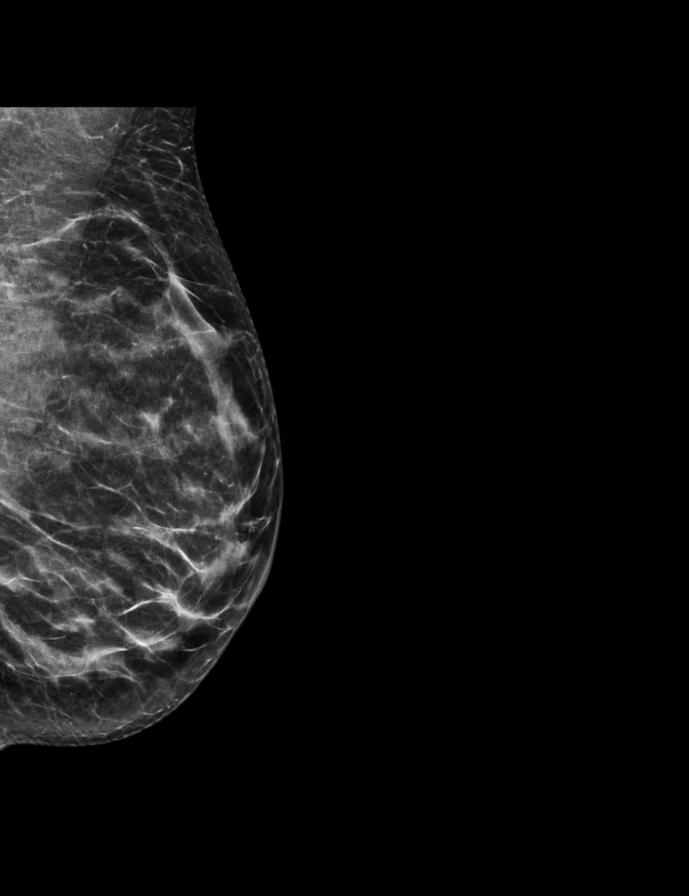

[L CC tomo · 2 of 48 frames shown]
[frame 16/48]
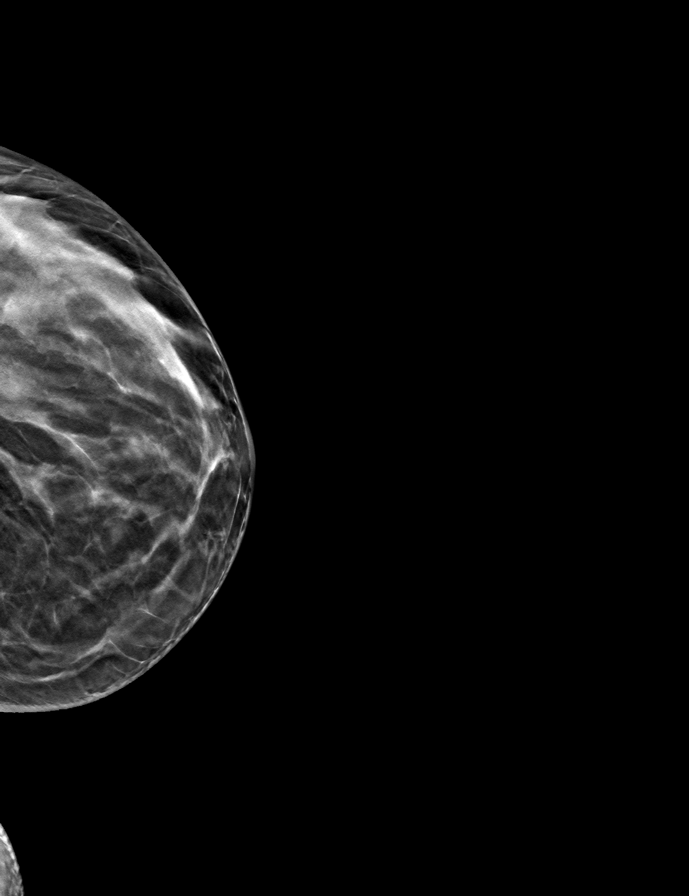
[frame 25/48]
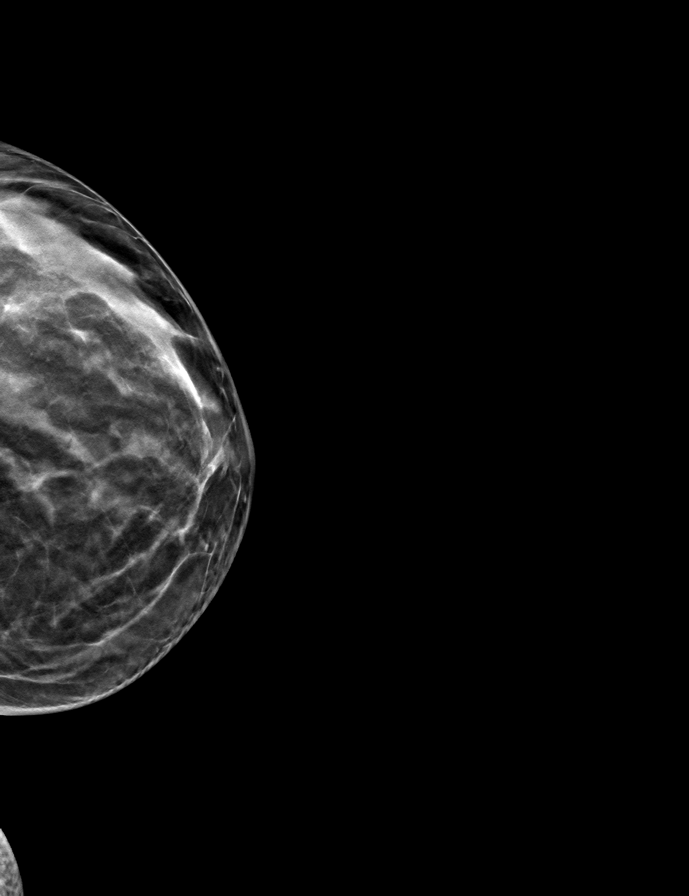

[L MLO tomo · tomo slice 25/49.0]
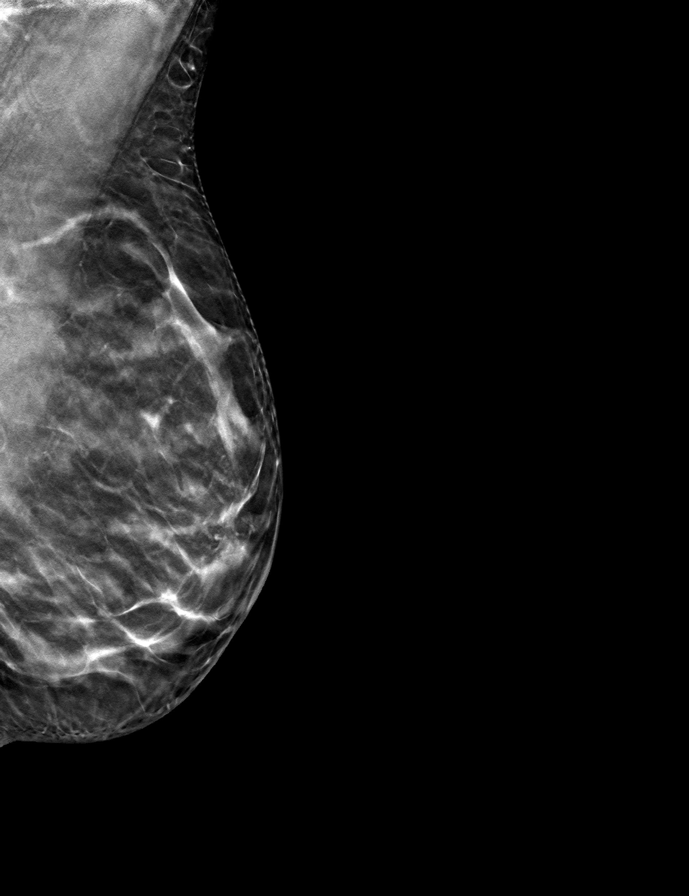

[R CC tomo · tomo slice 23/45.0]
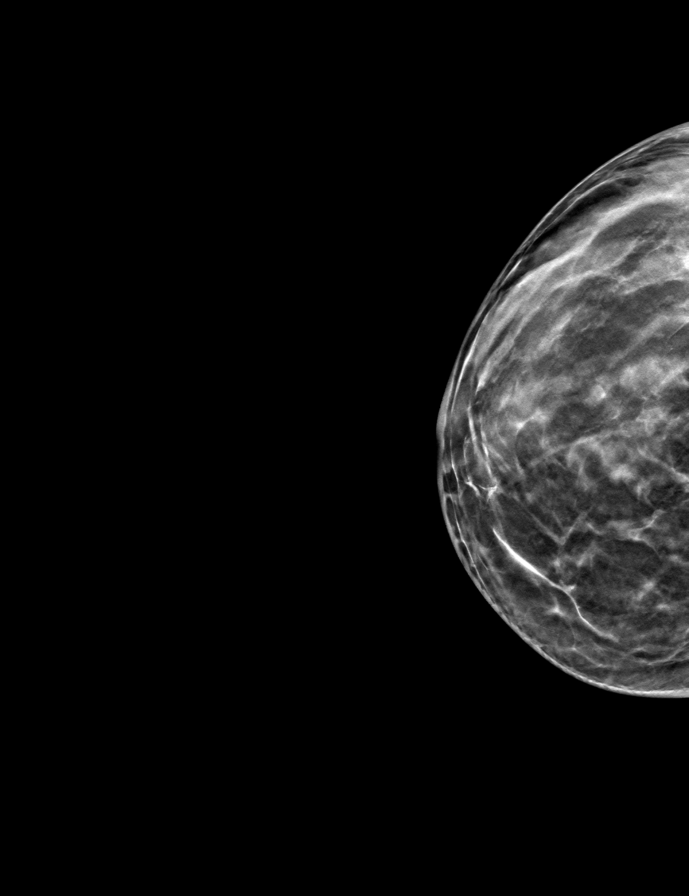

[R MLO tomo · tomo slice 23/45.0]
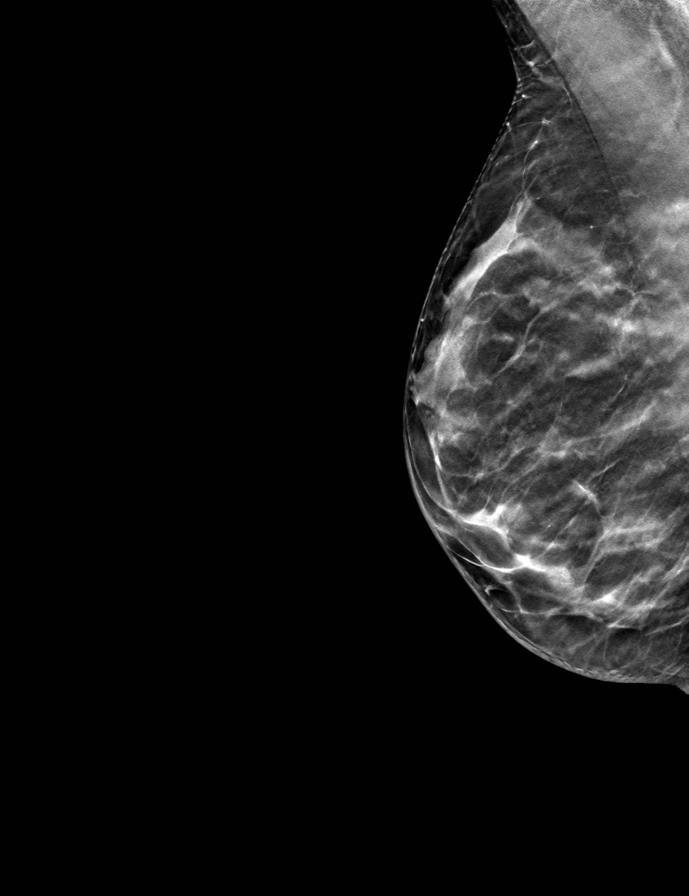

[9 of 24 positions shown; findings below may reference images not displayed]

ACR Breast Density Category c: The breast tissue is heterogeneously
dense, which may obscure small masses.
FINDINGS: Circumscribed mass in the lower outer right breast is
mammographically stable. No new or suspicious mammographic findings
are identified in the remainder of either breast.

Mammographic images were processed with CAD.

Targeted ultrasound is performed, showing stable appearance of an
oval, circumscribed hypoechoic mass at the [DATE] position 2 cm from
the nipple. It measures 1.5 x 1.3 x 0.6 cm (previously 1.5 x 1.3 x
0.6 cm).
IMPRESSION: 1. No mammographic evidence of malignancy in either breast.
2. Benign right breast mass demonstrating greater than 2 year
stability. No further imaging follow-up required.

RECOMMENDATION:
Screening mammogram in one year.(Code:WG-L-YAC)

I have discussed the findings and recommendations with the patient.
If applicable, a reminder letter will be sent to the patient
regarding the next appointment.

BI-RADS CATEGORY  2: Benign.

## 2021-11-26 IMAGING — US US THYROID
1 series · 14 of 25 positions shown · non-contrast
Comparison: None.

CLINICAL DATA: Palpable abnormality.

EXAM:
THYROID ULTRASOUND
TECHNIQUE: Ultrasound examination of the thyroid gland and adjacent soft
tissues was performed.

[Series 1: us thyroid · 0.07mm/px · 14 of 27 slices shown]
[im 1/27]
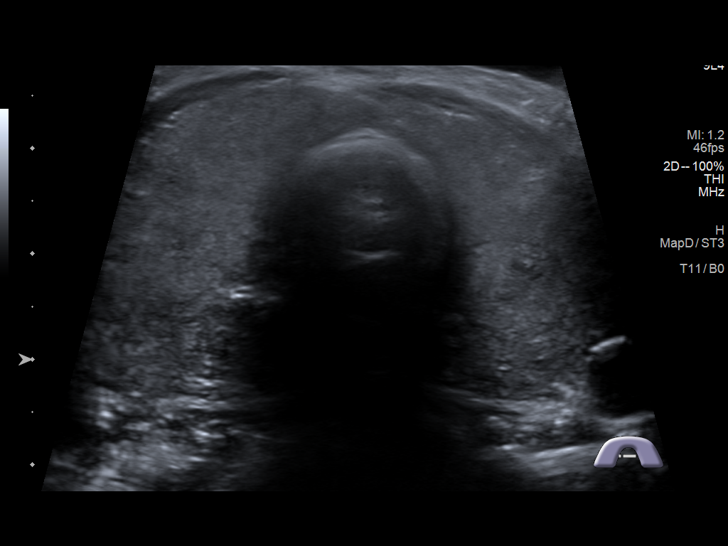
[im 3/27]
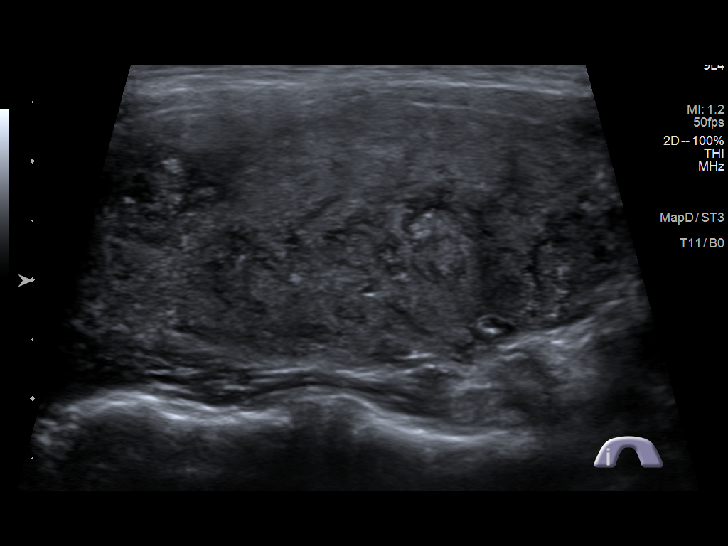
[im 5/27]
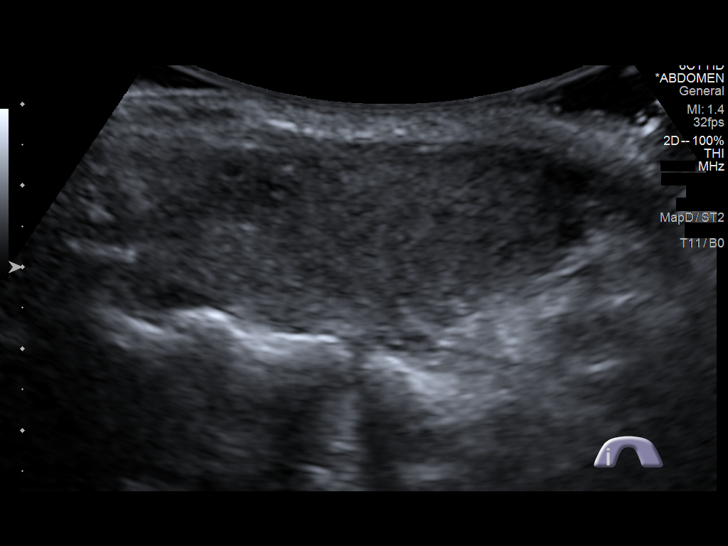
[im 7/27]
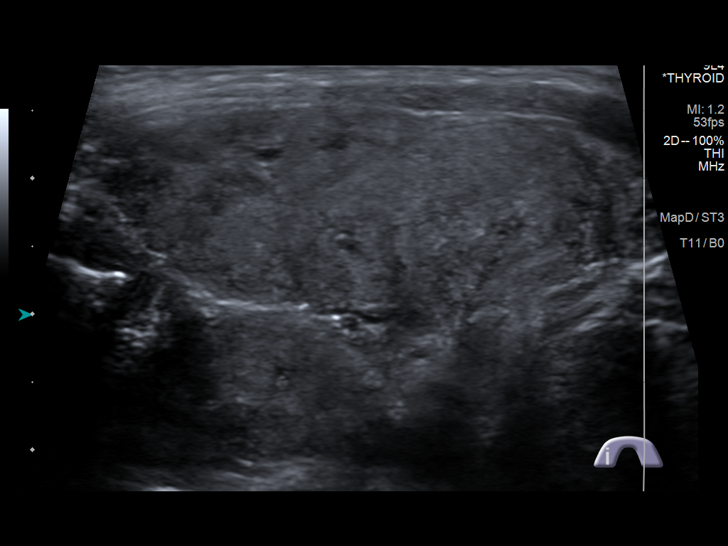
[im 9/27]
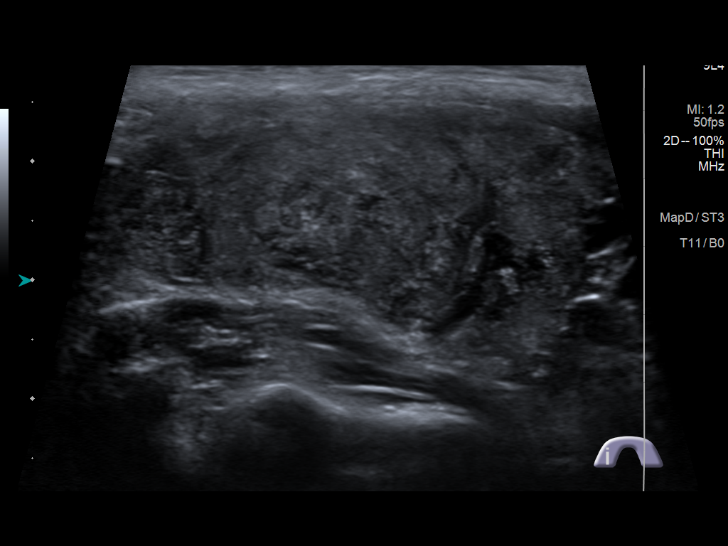
[im 10/27]
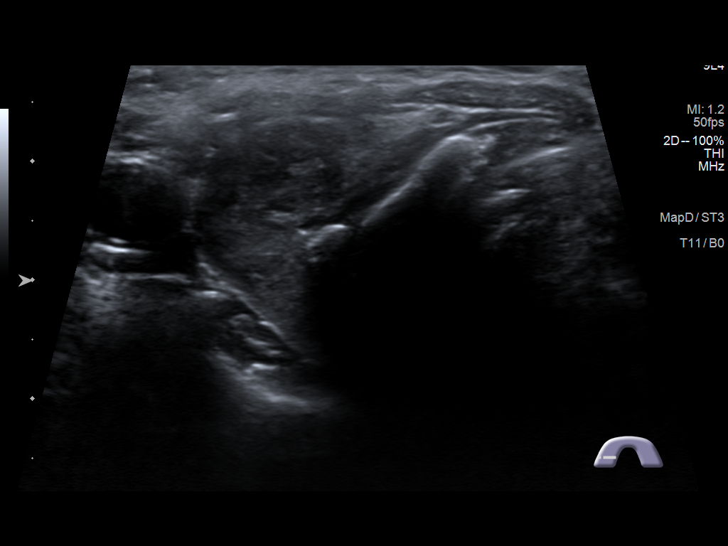
[im 12/27]
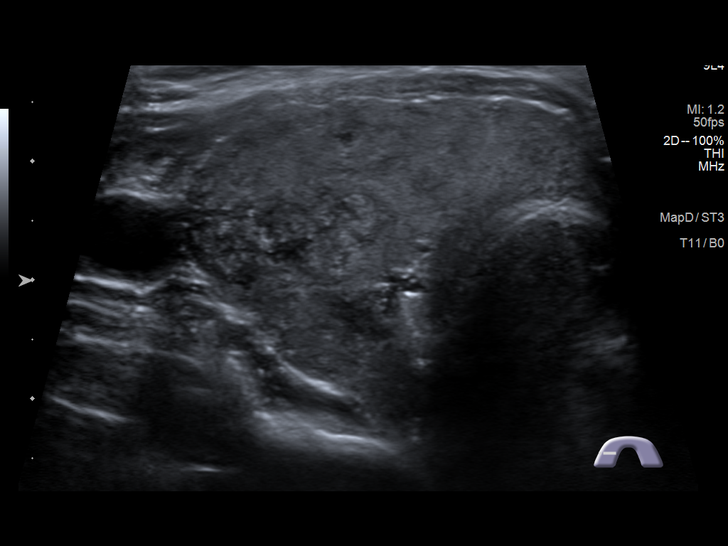
[im 15/27]
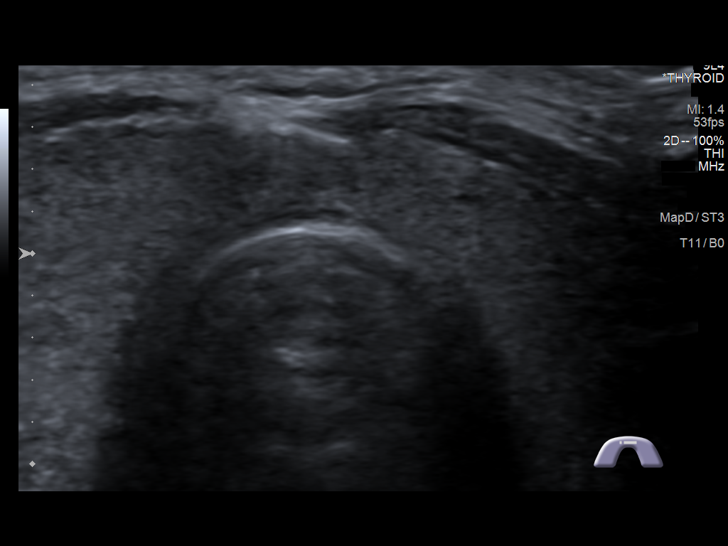
[im 17/27]
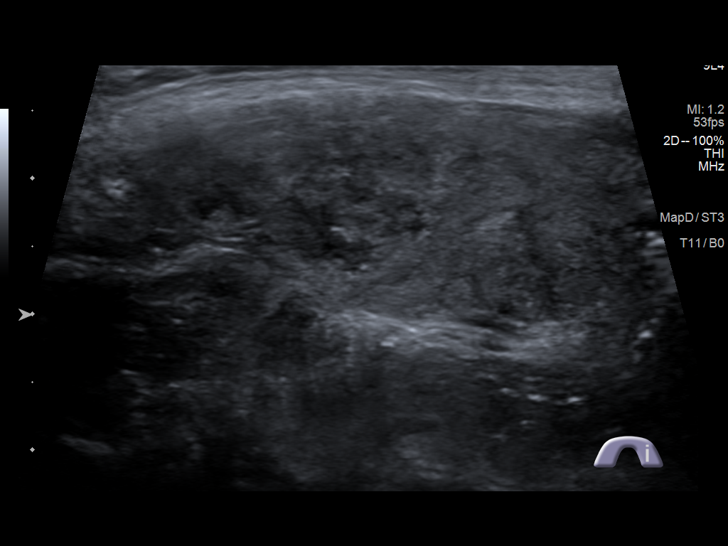
[im 18/27]
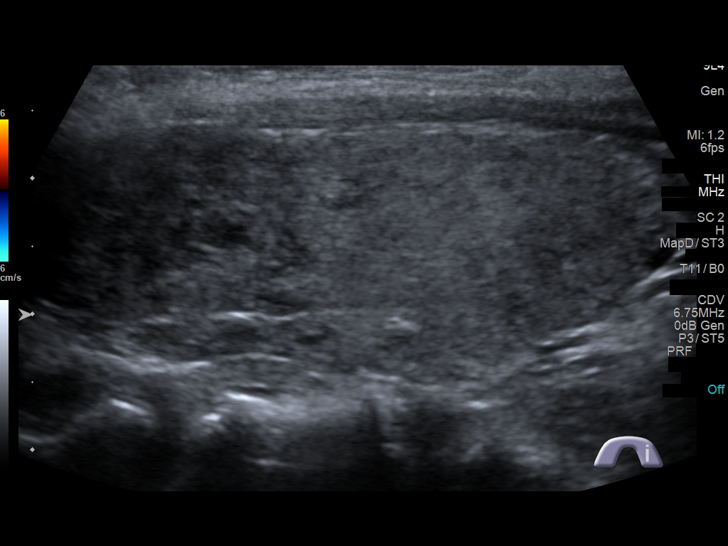
[im 20/27]
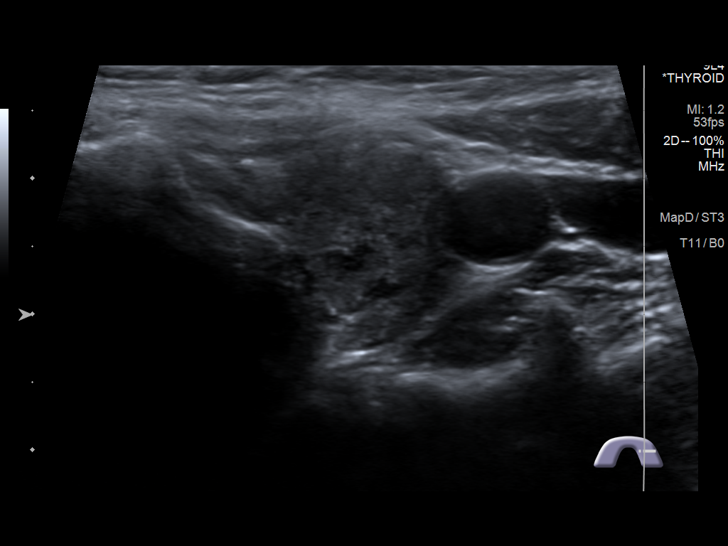
[im 22/27]
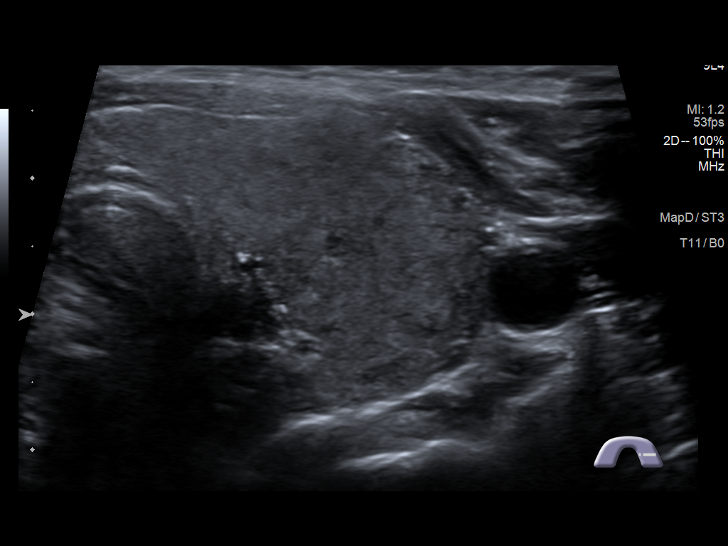
[im 24/27]
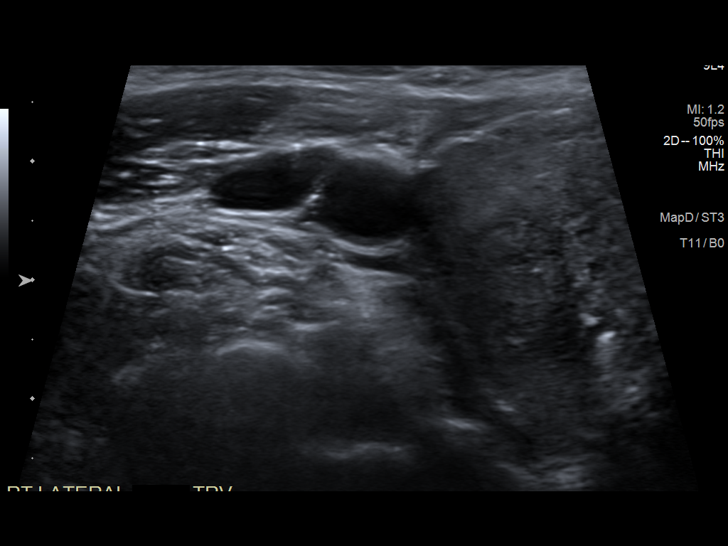
[im 27/27]
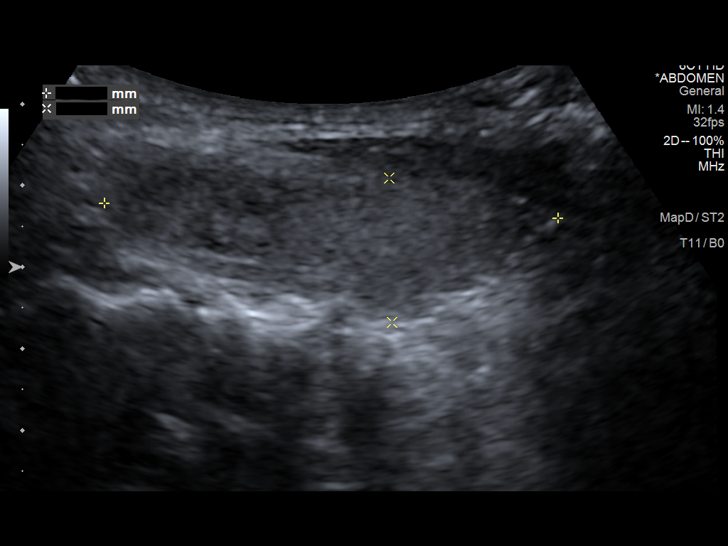

[14 of 25 positions shown; findings below may reference images not displayed]

FINDINGS: Parenchymal Echotexture: Markedly heterogenous, diffusely hyperemic.

Isthmus: 0.4 cm

Right lobe: 6.2 x 2.7 x 2.1 cm

Left lobe: 5.6 x 1.8 x 1.9 cm

_________________________________________________________

Estimated total number of nodules >/= 1 cm: 0

Number of spongiform nodules >/=  2 cm not described below (TR1): 0

Number of mixed cystic and solid nodules >/= 1.5 cm not described
below (TR2): 0

_________________________________________________________

No discrete nodules are seen within the thyroid gland.
IMPRESSION: Diffusely enlarged, heterogeneous, and hyperemic thyroid gland as
could be seen with autoimmune thyroid disease or thyroiditis. No
discrete nodules are identified.

## 2022-02-27 ENCOUNTER — Telehealth: Payer: Self-pay | Admitting: Physician Assistant

## 2022-02-27 DIAGNOSIS — R748 Abnormal levels of other serum enzymes: Secondary | ICD-10-CM

## 2022-02-27 DIAGNOSIS — R7989 Other specified abnormal findings of blood chemistry: Secondary | ICD-10-CM

## 2022-02-27 DIAGNOSIS — Z1321 Encounter for screening for nutritional disorder: Secondary | ICD-10-CM

## 2022-02-27 NOTE — Telephone Encounter (Signed)
Pt called and states she has left a couple messages this week for Jade's assistant with no return phone call back and  Pt wants a lab order to check her Liver Enzymes and also check to see if she has any vitamin deficiencies. Please advise pt when you place order so she knows she can come in for blood work. She would like to have this done before she makes an appt so she can go over results with the provider.

## 2022-03-03 LAB — LIPID PANEL
Cholesterol: 308 — AB (ref 0–200)
HDL: 98 — AB (ref 35–70)
LDL Cholesterol: 195
Triglycerides: 57 (ref 40–160)

## 2022-03-03 LAB — BASIC METABOLIC PANEL: Glucose: 74

## 2022-03-03 LAB — HEMOGLOBIN A1C: Hemoglobin A1C: 4.7

## 2022-03-20 ENCOUNTER — Encounter: Payer: Self-pay | Admitting: Physician Assistant

## 2022-03-20 ENCOUNTER — Ambulatory Visit (INDEPENDENT_AMBULATORY_CARE_PROVIDER_SITE_OTHER): Payer: BC Managed Care – PPO | Admitting: Physician Assistant

## 2022-03-20 VITALS — BP 114/65 | HR 72 | Ht 63.0 in | Wt 139.0 lb

## 2022-03-20 DIAGNOSIS — E7800 Pure hypercholesterolemia, unspecified: Secondary | ICD-10-CM

## 2022-03-20 DIAGNOSIS — Z1231 Encounter for screening mammogram for malignant neoplasm of breast: Secondary | ICD-10-CM | POA: Diagnosis not present

## 2022-03-20 DIAGNOSIS — Z1321 Encounter for screening for nutritional disorder: Secondary | ICD-10-CM | POA: Diagnosis not present

## 2022-03-20 DIAGNOSIS — D649 Anemia, unspecified: Secondary | ICD-10-CM | POA: Diagnosis not present

## 2022-03-20 DIAGNOSIS — R748 Abnormal levels of other serum enzymes: Secondary | ICD-10-CM | POA: Diagnosis not present

## 2022-03-20 DIAGNOSIS — R7989 Other specified abnormal findings of blood chemistry: Secondary | ICD-10-CM | POA: Diagnosis not present

## 2022-03-20 DIAGNOSIS — Z0000 Encounter for general adult medical examination without abnormal findings: Secondary | ICD-10-CM

## 2022-03-20 DIAGNOSIS — E063 Autoimmune thyroiditis: Secondary | ICD-10-CM

## 2022-03-20 DIAGNOSIS — Z8249 Family history of ischemic heart disease and other diseases of the circulatory system: Secondary | ICD-10-CM

## 2022-03-20 MED ORDER — ATORVASTATIN CALCIUM 20 MG PO TABS: 20.0000 mg | ORAL_TABLET | Freq: Every day | ORAL | 3 refills | Status: DC

## 2022-03-20 NOTE — Progress Notes (Addendum)
Complete physical exam  Patient: Carrie [name]   DOB: 03-15-1971   51 y.o. Female  MRN: [id number]  Subjective:    Chief Complaint  Patient presents with   Annual Exam    Carrie [name] is a 51 y.o. female who presents today for a complete physical exam. She reports consuming a  healthy  diet.  She walks 3 miles every day and walks a total of 12 miles each week.   She generally feels well.  Pt is concerned about high cholesterol and LDL, but feels hesitant to start medication due to potential adverse effects. She would like to check labs today.   Pt reports taking livercare for history of elevated liver enzymes and bone restore for bone health due to lack of calcium in her diet. Would like to check calcium levels today.   LMP over 2 years ago, but denies any menopausal symptoms yet.    Most recent fall risk assessment:    08/19/2020    9:24 AM  [place] in the past year? 1  Number falls in past yr: 1  Injury with Fall? 0  Follow up Falls evaluation completed     Most recent depression screenings:    03/20/2022   10:12 AM 08/19/2020    9:24 AM  PHQ 2/9 Scores  PHQ - 2 Score 0 0  PHQ- 9 Score 0     Vision:Within last year and Dental: No current dental problems Pt reports she goes to the dentist and eye dr.   Patient Active Problem List   Diagnosis Date Noted   Pure hypercholesterolemia 03/20/2022   Hashimoto's thyroiditis 08/28/2020   Elevated LDL cholesterol level 08/27/2020   Elevated liver enzymes 08/27/2020   Thyroid with heterogeneous echotexture determined by ultrasound 08/27/2020   Elevated TSH 08/27/2020   Acute pain of right knee 08/16/2019   Perimenopausal 08/16/2019   Post concussion syndrome 12/28/2018   Concussion with no loss of consciousness 12/28/2018   DUB (dysfunctional uterine bleeding) 08/18/2017   Post-menopausal 08/18/2017   Fibroadenoma of right breast 09/17/2015   Breast mass, right 08/29/2015   Constipation 08/28/2015   Past  Medical History:  Diagnosis Date   Hyperlipidemia    Past Surgical History:  Procedure Laterality Date   CESAREAN SECTION     WISDOM TOOTH EXTRACTION     no sedation   Social History   Tobacco Use   Smoking status: Never   Smokeless tobacco: Never  Substance Use Topics   Alcohol use: Yes    Comment: rare - 1 drink in 1 year   Drug use: No   Social History   Socioeconomic History   Marital status: Married    Spouse name: Not on file   Number of children: Not on file   Years of education: Not on file   Highest education level: Not on file  Occupational History   Not on file  Tobacco Use   Smoking status: Never   Smokeless tobacco: Never  Substance and Sexual Activity   Alcohol use: Yes    Comment: rare - 1 drink in 1 year   Drug use: No   Sexual activity: Yes  Other Topics Concern   Not on file  Social History Narrative   Not on file   Social Determinants of Health   Financial Resource Strain: Not on file  Food Insecurity: Not on file  Transportation Needs: Not on file  Physical Activity: Not on file  Stress: Not on  file  Social Connections: Not on file  Intimate Partner Violence: Not on file   Family Status  Relation Name Status   Mother  (Not Specified)   Neg Hx  (Not Specified)   Family History  Problem Relation Age of Onset   Diabetes Mother    Colon cancer Neg Hx    Colon polyps Neg Hx    Esophageal cancer Neg Hx    Rectal cancer Neg Hx    Stomach cancer Neg Hx    No Known Allergies  Patient Care Team: Lavada Mesi as PCP - General (Family Medicine)   Outpatient Medications Prior to Visit  Medication Sig   lidocaine (XYLOCAINE) 2 % solution 5 Ml swish and swallow every 4 hours as needed for throat pain   MAGNESIUM PO Take 500 mg by mouth daily.   OVER THE COUNTER MEDICATION Smooth Move Herbal Tea Q Night Bone Restore Elite with super potent K2 Livercare   TURMERIC PO Take by mouth.   VITAMIN D PO Take 5,000 Units by mouth  daily.   VITAMIN D, CHOLECALCIFEROL, PO Take 5,000 Int'l Units by mouth daily.   [DISCONTINUED] Ascorbic Acid (VITAMIN C) 100 MG tablet Take 100 mg by mouth daily.   [DISCONTINUED] benzonatate (TESSALON) 100 MG capsule Take 1 capsule (100 mg total) by mouth 3 (three) times daily as needed.   [DISCONTINUED] Multiple Vitamins-Minerals (MULTIVITAMIN ADULT PO) Take by mouth daily.   No facility-administered medications prior to visit.    ROS Denies abdominal pain, bowel changes, trouble swallowing     Objective:     BP 114/65   [name] 72   Ht '5\' 3"'$  (1.6 m)   Wt 139 lb (63 kg)   LMP 09/02/2015 (Approximate)   SpO2 100%   BMI 24.62 kg/m     Physical Exam Constitutional:      Appearance: Normal appearance. She is not ill-appearing.  HENT:     Head: Normocephalic and atraumatic.     Right Ear: Tympanic membrane, ear canal and external ear normal. There is no impacted cerumen.     Left Ear: Tympanic membrane, ear canal and external ear normal. There is no impacted cerumen.     Nose: Nose normal. No congestion or rhinorrhea.     Mouth/Throat:     Mouth: Mucous membranes are moist.     Pharynx: Oropharynx is clear. No oropharyngeal exudate or posterior oropharyngeal erythema.  Eyes:     Extraocular Movements: Extraocular movements intact.     Conjunctiva/sclera: Conjunctivae normal.     Pupils: Pupils are equal, round, and reactive to light.  Neck:     Vascular: No carotid bruit.     Comments: Thyromegaly, symmetric and nontender thyroid Cardiovascular:     Rate and Rhythm: Normal rate and regular rhythm.     Pulses: Normal pulses.     Heart sounds: Normal heart sounds. No murmur heard.    No friction rub. No gallop.  Pulmonary:     Effort: Pulmonary effort is normal.     Breath sounds: Normal breath sounds.  Abdominal:     General: Abdomen is flat. Bowel sounds are normal.     Palpations: Abdomen is soft.     Tenderness: There is no abdominal tenderness.  Musculoskeletal:      Cervical back: Normal range of motion and neck supple. No tenderness.  Neurological:     Mental Status: She is alert and oriented to person, place, and time.  Psychiatric:  Mood and Affect: Mood normal.      No results found for any visits on 03/20/22. Last CBC Lab Results  Component Value Date   WBC 4.3 08/26/2020   HGB 14.1 08/26/2020   HCT 42.7 08/26/2020   MCV 91.8 08/26/2020   MCH 30.3 08/26/2020   RDW 12.4 08/26/2020   PLT 298 64/40/3474   Last metabolic panel Lab Results  Component Value Date   GLUCOSE 80 11/08/2020   NA 138 11/08/2020   K 3.9 11/08/2020   CL 102 11/08/2020   CO2 29 11/08/2020   BUN 11 11/08/2020   CREATININE 0.73 11/08/2020   GFRNONAA 96 11/08/2020   CALCIUM 9.5 11/08/2020   PROT 6.7 11/08/2020   BILITOT 0.8 11/08/2020   AST 35 11/08/2020   ALT 37 (H) 11/08/2020   Last lipids Lab Results  Component Value Date   CHOL 274 (H) 08/26/2020   HDL 104 08/26/2020   LDLCALC 155 (H) 08/26/2020   TRIG 53 08/26/2020   CHOLHDL 2.6 08/26/2020   Last hemoglobin A1c No results found for: "HGBA1C" Last thyroid functions Lab Results  Component Value Date   TSH 4.01 08/26/2020        Assessment & Plan:    Routine Health Maintenance and Physical Exam  Immunization History  Administered Date(s) Administered   Tdap 08/19/2020    Health Maintenance  Topic Date Due   MAMMOGRAM  05/24/2021   Zoster Vaccines- Shingrix (1 of 2) 06/19/2022 (Originally [birth year]/1989)   INFLUENZA VACCINE  10/04/2022 (Originally 02/03/2022)   PAP SMEAR-Modifier  08/14/2024   TETANUS/TDAP  08/19/2030   COLONOSCOPY (Pts 45-21yr Insurance coverage will need to be confirmed)  01/25/2031   Hepatitis C Screening  Completed   HIV Screening  Completed   HPV VACCINES  Aged Out    Discussed health benefits of physical activity, and encouraged her to engage in regular exercise appropriate for her age and condition.  .Marland KitchenJuliann Pulsewas seen today for annual  exam.  Diagnoses and all orders for this visit:  Routine physical examination  Encounter for screening mammogram for malignant neoplasm of breast -     MM 3D SCREEN BREAST BILATERAL  Pure hypercholesterolemia -     atorvastatin (LIPITOR) 20 MG tablet; Take 1 tablet (20 mg total) by mouth daily.  Hashimoto's thyroiditis   ..Marland KitchenDiscussed 150 minutes of exercise a week.  Encouraged vitamin D 1000 units and Calcium '1300mg'$  or 4 servings of dairy a day.  PHQ no concerns Reviewed fasting labs Mammogram ordered Pap UTD Colonoscopy UTD Declined flu/covid/shingles vaccines.    ASCVD score 39%. Pt educated on risk vs benefit of statin therapy for hyperlipidemia and prevention of cardiovascular events. Pt agrees to start low dose statin daily and to recheck labs in 6 months. Will also start daily Aspirin 81 mg. I would like to get CT calcium score to help access patients risk. Advised to follow up with any concerns. HO given. Recheck labs in 4-6 months.   Thyroid UKoreawithin normal limits in 2022. TSH ordered to follow function.   Discussed checking FSH and LH to assess for menopause. Pt declines at this time.    JIran Planas PA-C

## 2022-03-20 NOTE — Patient Instructions (Addendum)
Atorvastatin Tablets What is this medication? ATORVASTATIN (a TORE va sta tin) treats high cholesterol and reduces the risk of heart attack and stroke. It works by decreasing bad cholesterol and fats (such as LDL, triglycerides) and increasing good cholesterol (HDL) in your blood. It belongs to a group of medications called statins. Changes to diet and exercise are often combined with this medication. This medicine may be used for other purposes; ask your health care provider or pharmacist if you have questions. COMMON BRAND NAME(S): Lipitor What should I tell my care team before I take this medication? They need to know if you have any of these conditions: Diabetes Frequently drink alcohol Kidney disease Liver disease Muscle cramps, pain Stroke Thyroid disease An unusual or allergic reaction to atorvastatin, other medications, foods, dyes, or preservatives Pregnant or trying to get pregnant Breast-feeding How should I use this medication? Take this medication by mouth. Take it as directed on the label at the same time every day. You can take it with or without food. If it upsets your stomach, take it with food. Keep taking it unless your care team tells you to stop. Do not take this medication with grapefruit juice. Talk to your care team about the use of this medication in children. While it may be prescribed for children as young as 10 years for selected conditions, precautions do apply. Overdosage: If you think you have taken too much of this medicine contact a poison control center or emergency room at once. NOTE: This medicine is only for you. Do not share this medicine with others. What if I miss a dose? If you miss a dose, take it as soon as you can unless it is more than 12 hours late. If it is more than 12 hours late, skip the missed dose. Take the next dose at the normal time. If it is almost time for your next dose, take only that dose. Do not take double or extra doses. What  may interact with this medication? Do not take this medication with any of the following: Dasabuvir; ombitasvir; paritaprevir; ritonavir Lonafarnib Ombitasvir; paritaprevir; ritonavir Posaconazole Red yeast rice This medication may also interact with the following: Alcohol Certain antibiotics like erythromycin and clarithromycin Certain antivirals for HIV or hepatitis Certain medications for cholesterol like fenofibrate, gemfibrozil, and niacin Certain medications for fungal infections like ketoconazole, itraconazole, and voriconazole Colchicine Cyclosporine Digoxin Estrogen or progestin hormones Grapefruit juice Rifampin This list may not describe all possible interactions. Give your health care provider a list of all the medicines, herbs, non-prescription drugs, or dietary supplements you use. Also tell them if you smoke, drink alcohol, or use illegal drugs. Some items may interact with your medicine. What should I watch for while using this medication? Visit your care team for regular checks on your progress. Tell your care team if your symptoms do not start to get better or if they get worse. Your care team may tell you to stop taking this medication if you develop muscle problems. If your muscle problems do not go away after stopping this medication, contact your care team. This medication may increase blood sugar. The risk may be higher in patients who already have diabetes. Ask your care team what you can do to lower your risk of diabetes while on this medication. If you are going to need surgery or other procedure, tell your care team that you are using this medication. Taking this medication is only part of a total heart healthy program. Ask  your care team if there are other changes you can make to improve your overall health. Drinking more than 2 alcoholic drinks every day with this medication can increase the risk of side effects. Talk to your care team if you wish to become  pregnant or think you might be pregnant. This medication can cause serious birth defects. Talk to your care team before breastfeeding. Changes to your treatment plan may be needed. What side effects may I notice from receiving this medication? Side effects that you should report to your care team as soon as possible: Allergic reactions--skin rash, itching, hives, swelling of the face, lips, tongue, or throat High blood sugar (hyperglycemia)--increased thirst or amount of urine, unusual weakness or fatigue, blurry vision Liver injury--right upper belly pain, loss of appetite, nausea, light-colored stool, dark yellow or brown urine, yellowing skin or eyes, unusual weakness or fatigue Muscle injury--unusual weakness or fatigue, muscle pain, dark yellow or brown urine, decrease in amount of urine Redness, blistering, peeling, or loosening of the skin, including inside the mouth Side effects that usually do not require medical attention (report to your care team if they continue or are bothersome): Diarrhea Nausea Trouble sleeping Upset stomach This list may not describe all possible side effects. Call your doctor for medical advice about side effects. You may report side effects to FDA at 1-800-FDA-1088. Where should I keep my medication? Keep out of the reach of children and pets. Store at room temperature between 20 and 25 degrees C (68 and 77 degrees F). Get rid of any unused medication after the expiration date. To get rid of medications that are no longer needed or have expired: Take the medication to a medication take-back program. Check with your pharmacy or law enforcement to find a location. If you cannot return the medication, check the label or package insert to see if the medication should be thrown out in the garbage or flushed down the toilet. If you are not sure, ask your care team. If it is safe to put it in the trash, take the medication out of the container. Mix the medication with  cat litter, dirt, coffee grounds, or other unwanted substance. Seal the mixture in a bag or container. Put it in the trash. NOTE: This sheet is a summary. It may not cover all possible information. If you have questions about this medicine, talk to your doctor, pharmacist, or health care provider.  2023 Elsevier/Gold Standard (2021-10-23 00:00:00) Health Maintenance, Female Adopting a healthy lifestyle and getting preventive care are important in promoting health and wellness. Ask your health care provider about: The right schedule for you to have regular tests and exams. Things you can do on your own to prevent diseases and keep yourself healthy. What should I know about diet, weight, and exercise? Eat a healthy diet  Eat a diet that includes plenty of vegetables, fruits, low-fat dairy products, and lean protein. Do not eat a lot of foods that are high in solid fats, added sugars, or sodium. Maintain a healthy weight Body mass index (BMI) is used to identify weight problems. It estimates body fat based on height and weight. Your health care provider can help determine your BMI and help you achieve or maintain a healthy weight. Get regular exercise Get regular exercise. This is one of the most important things you can do for your health. Most adults should: Exercise for at least 150 minutes each week. The exercise should increase your heart rate and make you sweat (moderate-intensity  exercise). Do strengthening exercises at least twice a week. This is in addition to the moderate-intensity exercise. Spend less time sitting. Even light physical activity can be beneficial. Watch cholesterol and blood lipids Have your blood tested for lipids and cholesterol at 52 years of age, then have this test every 5 years. Have your cholesterol levels checked more often if: Your lipid or cholesterol levels are high. You are older than 52 years of age. You are at high risk for heart disease. What should I  know about cancer screening? Depending on your health history and family history, you may need to have cancer screening at various ages. This may include screening for: Breast cancer. Cervical cancer. Colorectal cancer. Skin cancer. Lung cancer. What should I know about heart disease, diabetes, and high blood pressure? Blood pressure and heart disease High blood pressure causes heart disease and increases the risk of stroke. This is more likely to develop in people who have high blood pressure readings or are overweight. Have your blood pressure checked: Every 3-5 years if you are 45-30 years of age. Every year if you are 41 years old or older. Diabetes Have regular diabetes screenings. This checks your fasting blood sugar level. Have the screening done: Once every three years after age 38 if you are at a normal weight and have a low risk for diabetes. More often and at a younger age if you are overweight or have a high risk for diabetes. What should I know about preventing infection? Hepatitis B If you have a higher risk for hepatitis B, you should be screened for this virus. Talk with your health care provider to find out if you are at risk for hepatitis B infection. Hepatitis C Testing is recommended for: Everyone born from [birth year] through 1966. Anyone with known risk factors for hepatitis C. Sexually transmitted infections (STIs) Get screened for STIs, including gonorrhea and chlamydia, if: You are sexually active and are younger than 52 years of age. You are older than 52 years of age and your health care provider tells you that you are at risk for this type of infection. Your sexual activity has changed since you were last screened, and you are at increased risk for chlamydia or gonorrhea. Ask your health care provider if you are at risk. Ask your health care provider about whether you are at high risk for HIV. Your health care provider may recommend a prescription medicine to help  prevent HIV infection. If you choose to take medicine to prevent HIV, you should first get tested for HIV. You should then be tested every 3 months for as long as you are taking the medicine. Pregnancy If you are about to stop having your period (premenopausal) and you may become pregnant, seek counseling before you get pregnant. Take 400 to 800 micrograms (mcg) of folic acid every day if you become pregnant. Ask for birth control (contraception) if you want to prevent pregnancy. Osteoporosis and menopause Osteoporosis is a disease in which the bones lose minerals and strength with aging. This can result in bone fractures. If you are 96 years old or older, or if you are at risk for osteoporosis and fractures, ask your health care provider if you should: Be screened for bone loss. Take a calcium or vitamin D supplement to lower your risk of fractures. Be given hormone replacement therapy (HRT) to treat symptoms of menopause. Follow these instructions at home: Alcohol use Do not drink alcohol if: Your health care provider tells you  not to drink. You are pregnant, may be pregnant, or are planning to become pregnant. If you drink alcohol: Limit how much you have to: 0-1 drink a day. Know how much alcohol is in your drink. In the [place] one drink equals one 12 oz bottle of beer (355 mL), one 5 oz glass of wine (148 mL), or one 1 oz glass of hard liquor (44 mL). Lifestyle Do not use any products that contain nicotine or tobacco. These products include cigarettes, chewing tobacco, and vaping devices, such as e-cigarettes. If you need help quitting, ask your health care provider. Do not use street drugs. Do not share needles. Ask your health care provider for help if you need support or information about quitting drugs. General instructions Schedule regular health, dental, and eye exams. Stay current with your vaccines. Tell your health care provider if: You often feel depressed. You have ever  been abused or do not feel safe at home. Summary Adopting a healthy lifestyle and getting preventive care are important in promoting health and wellness. Follow your health care provider's instructions about healthy diet, exercising, and getting tested or screened for diseases. Follow your health care provider's instructions on monitoring your cholesterol and blood pressure. This information is not intended to replace advice given to you by your health care provider. Make sure you discuss any questions you have with your health care provider. Document Revised: 11/11/2020 Document Reviewed: 11/11/2020 Elsevier Patient Education  [place].

## 2022-03-20 NOTE — Addendum Note (Signed)
Addended by: Donella Stade on: 03/20/2022 01:44 PM   Modules accepted: Orders

## 2022-03-21 LAB — TSH: TSH: 3.68 mIU/L

## 2022-03-21 LAB — CBC WITH DIFFERENTIAL/PLATELET
Absolute Monocytes: 420 cells/uL (ref 200–950)
Basophils Absolute: 70 cells/uL (ref 0–200)
Basophils Relative: 1.4 %
Eosinophils Absolute: 150 cells/uL (ref 15–500)
Eosinophils Relative: 3 %
HCT: 43.6 % (ref 35.0–45.0)
Hemoglobin: 14.2 g/dL (ref 11.7–15.5)
Lymphs Abs: 1775 cells/uL (ref 850–3900)
MCH: 30.1 pg (ref 27.0–33.0)
MCHC: 32.6 g/dL (ref 32.0–36.0)
MCV: 92.4 fL (ref 80.0–100.0)
MPV: 9.7 fL (ref 7.5–12.5)
Monocytes Relative: 8.4 %
Neutro Abs: 2585 cells/uL (ref 1500–7800)
Neutrophils Relative %: 51.7 %
Platelets: 359 10*3/uL (ref 140–400)
RBC: 4.72 10*6/uL (ref 3.80–5.10)
RDW: 12.6 % (ref 11.0–15.0)
Total Lymphocyte: 35.5 %
WBC: 5 10*3/uL (ref 3.8–10.8)

## 2022-03-21 LAB — COMPLETE METABOLIC PANEL WITH GFR
AG Ratio: 1.9 (calc) (ref 1.0–2.5)
ALT: 27 U/L (ref 6–29)
AST: 31 U/L (ref 10–35)
Albumin: 4.7 g/dL (ref 3.6–5.1)
Alkaline phosphatase (APISO): 112 U/L (ref 37–153)
BUN: 10 mg/dL (ref 7–25)
CO2: 29 mmol/L (ref 20–32)
Calcium: 9.6 mg/dL (ref 8.6–10.4)
Chloride: 101 mmol/L (ref 98–110)
Creat: 0.77 mg/dL (ref 0.50–1.03)
Globulin: 2.5 g/dL (calc) (ref 1.9–3.7)
Glucose, Bld: 80 mg/dL (ref 65–99)
Potassium: 4.9 mmol/L (ref 3.5–5.3)
Sodium: 136 mmol/L (ref 135–146)
Total Bilirubin: 0.6 mg/dL (ref 0.2–1.2)
Total Protein: 7.2 g/dL (ref 6.1–8.1)
eGFR: 93 mL/min/{1.73_m2} (ref >=60)

## 2022-03-21 LAB — B12 AND FOLATE PANEL
Folate: 8.7 ng/mL
Vitamin B-12: 429 pg/mL (ref 200–1100)

## 2022-03-21 LAB — VITAMIN D 25 HYDROXY (VIT D DEFICIENCY, FRACTURES): Vit D, 25-Hydroxy: 65 ng/mL (ref 30–100)

## 2022-03-23 NOTE — Progress Notes (Signed)
Lise,   Kidney, liver, glucose look great.  B12  and folate look good.  Thyroid is normal range.  Hemoglobin looks great.  Vitamin D looks great.

## 2022-10-22 ENCOUNTER — Encounter: Payer: Self-pay | Admitting: Physician Assistant

## 2022-10-22 DIAGNOSIS — R748 Abnormal levels of other serum enzymes: Secondary | ICD-10-CM

## 2022-10-22 DIAGNOSIS — Z79899 Other long term (current) drug therapy: Secondary | ICD-10-CM

## 2022-10-22 DIAGNOSIS — E7800 Pure hypercholesterolemia, unspecified: Secondary | ICD-10-CM

## 2022-10-27 ENCOUNTER — Ambulatory Visit: Payer: BC Managed Care – PPO | Admitting: Physician Assistant

## 2022-10-30 DIAGNOSIS — Z79899 Other long term (current) drug therapy: Secondary | ICD-10-CM | POA: Diagnosis not present

## 2022-10-30 DIAGNOSIS — R748 Abnormal levels of other serum enzymes: Secondary | ICD-10-CM | POA: Diagnosis not present

## 2022-10-30 DIAGNOSIS — E7800 Pure hypercholesterolemia, unspecified: Secondary | ICD-10-CM | POA: Diagnosis not present

## 2022-10-30 LAB — COMPLETE METABOLIC PANEL WITH GFR
AG Ratio: 1.8 (calc) (ref 1.0–2.5)
ALT: 28 U/L (ref 6–29)
AST: 30 U/L (ref 10–35)
Albumin: 4.4 g/dL (ref 3.6–5.1)
Alkaline phosphatase (APISO): 114 U/L (ref 37–153)
BUN: 10 mg/dL (ref 7–25)
CO2: 28 mmol/L (ref 20–32)
Calcium: 9.6 mg/dL (ref 8.6–10.4)
Chloride: 99 mmol/L (ref 98–110)
Creat: 0.77 mg/dL (ref 0.50–1.03)
Globulin: 2.4 g/dL (calc) (ref 1.9–3.7)
Glucose, Bld: 79 mg/dL (ref 65–99)
Potassium: 5 mmol/L (ref 3.5–5.3)
Sodium: 134 mmol/L — ABNORMAL LOW (ref 135–146)
Total Bilirubin: 0.8 mg/dL (ref 0.2–1.2)
Total Protein: 6.8 g/dL (ref 6.1–8.1)
eGFR: 93 mL/min/{1.73_m2} (ref >=60)

## 2022-10-30 LAB — LIPID PANEL W/REFLEX DIRECT LDL
Cholesterol: 217 mg/dL — ABNORMAL HIGH (ref <200)
HDL: 105 mg/dL (ref >=50)
LDL Cholesterol (Calc): 100 mg/dL (calc) — ABNORMAL HIGH
Non-HDL Cholesterol (Calc): 112 mg/dL (calc) (ref <130)
Total CHOL/HDL Ratio: 2.1 (calc) (ref <5.0)
Triglycerides: 42 mg/dL (ref <150)

## 2022-11-03 NOTE — Progress Notes (Signed)
Arleatha,   Cholesterol did improve.  HDL increased. LDL at 100.  Looks great.   Glucose, kidney, liver look great.   Sodium just a little low.

## 2023-01-13 ENCOUNTER — Telehealth: Payer: Self-pay

## 2023-01-13 DIAGNOSIS — E7800 Pure hypercholesterolemia, unspecified: Secondary | ICD-10-CM

## 2023-01-13 MED ORDER — ATORVASTATIN CALCIUM 20 MG PO TABS: 20.0000 mg | ORAL_TABLET | Freq: Every day | ORAL | 0 refills | Status: DC

## 2023-01-13 NOTE — Telephone Encounter (Signed)
Patient called about her having 0 refills on atorvastatin, please advise, thanks.

## 2023-03-16 ENCOUNTER — Other Ambulatory Visit: Payer: Self-pay | Admitting: Physician Assistant

## 2023-03-16 DIAGNOSIS — Z8249 Family history of ischemic heart disease and other diseases of the circulatory system: Secondary | ICD-10-CM

## 2023-03-16 DIAGNOSIS — E7800 Pure hypercholesterolemia, unspecified: Secondary | ICD-10-CM

## 2023-03-25 DIAGNOSIS — R748 Abnormal levels of other serum enzymes: Secondary | ICD-10-CM | POA: Diagnosis not present

## 2023-03-25 DIAGNOSIS — E559 Vitamin D deficiency, unspecified: Secondary | ICD-10-CM | POA: Diagnosis not present

## 2023-03-25 DIAGNOSIS — Z131 Encounter for screening for diabetes mellitus: Secondary | ICD-10-CM | POA: Diagnosis not present

## 2023-03-25 DIAGNOSIS — Z1329 Encounter for screening for other suspected endocrine disorder: Secondary | ICD-10-CM | POA: Diagnosis not present

## 2023-03-25 DIAGNOSIS — E612 Magnesium deficiency: Secondary | ICD-10-CM | POA: Diagnosis not present

## 2023-03-25 DIAGNOSIS — N951 Menopausal and female climacteric states: Secondary | ICD-10-CM | POA: Diagnosis not present

## 2023-03-25 DIAGNOSIS — E785 Hyperlipidemia, unspecified: Secondary | ICD-10-CM | POA: Diagnosis not present

## 2023-03-25 DIAGNOSIS — E538 Deficiency of other specified B group vitamins: Secondary | ICD-10-CM | POA: Diagnosis not present

## 2023-04-10 ENCOUNTER — Other Ambulatory Visit: Payer: Self-pay | Admitting: Physician Assistant

## 2023-04-10 DIAGNOSIS — E7800 Pure hypercholesterolemia, unspecified: Secondary | ICD-10-CM

## 2023-04-29 DIAGNOSIS — N951 Menopausal and female climacteric states: Secondary | ICD-10-CM | POA: Diagnosis not present

## 2023-04-29 DIAGNOSIS — E612 Magnesium deficiency: Secondary | ICD-10-CM | POA: Diagnosis not present

## 2023-04-29 DIAGNOSIS — E785 Hyperlipidemia, unspecified: Secondary | ICD-10-CM | POA: Diagnosis not present

## 2023-04-29 DIAGNOSIS — E559 Vitamin D deficiency, unspecified: Secondary | ICD-10-CM | POA: Diagnosis not present

## 2023-05-05 DIAGNOSIS — R748 Abnormal levels of other serum enzymes: Secondary | ICD-10-CM | POA: Diagnosis not present

## 2023-05-13 DIAGNOSIS — R748 Abnormal levels of other serum enzymes: Secondary | ICD-10-CM | POA: Diagnosis not present
